# Patient Record
Sex: Female | Born: 2005 | Race: White | Hispanic: No | Marital: Single | Smoking: Never smoker
Health system: Southern US, Community
[De-identification: ages and names within clinical notes are randomized; demographics above are authoritative.]

## PROBLEM LIST (undated history)

## (undated) DIAGNOSIS — K5900 Constipation, unspecified: Secondary | ICD-10-CM

## (undated) DIAGNOSIS — F32A Depression, unspecified: Secondary | ICD-10-CM

## (undated) DIAGNOSIS — F329 Major depressive disorder, single episode, unspecified: Secondary | ICD-10-CM

## (undated) DIAGNOSIS — J45909 Unspecified asthma, uncomplicated: Secondary | ICD-10-CM

## (undated) DIAGNOSIS — G43D0 Abdominal migraine, not intractable: Secondary | ICD-10-CM

## (undated) DIAGNOSIS — H6690 Otitis media, unspecified, unspecified ear: Secondary | ICD-10-CM

## (undated) DIAGNOSIS — J309 Allergic rhinitis, unspecified: Secondary | ICD-10-CM

## (undated) DIAGNOSIS — F419 Anxiety disorder, unspecified: Secondary | ICD-10-CM

## (undated) DIAGNOSIS — K589 Irritable bowel syndrome without diarrhea: Secondary | ICD-10-CM

## (undated) HISTORY — DX: Abdominal migraine, not intractable: G43.D0

## (undated) HISTORY — PX: WISDOM TOOTH EXTRACTION: SHX21

## (undated) HISTORY — DX: Irritable bowel syndrome, unspecified: K58.9

## (undated) HISTORY — DX: Anxiety disorder, unspecified: F41.9

## (undated) HISTORY — DX: Constipation, unspecified: K59.00

## (undated) HISTORY — DX: Depression, unspecified: F32.A

## (undated) HISTORY — DX: Allergic rhinitis, unspecified: J30.9

## (undated) HISTORY — DX: Otitis media, unspecified, unspecified ear: H66.90

## (undated) HISTORY — DX: Unspecified asthma, uncomplicated: J45.909

---
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
## 2023-01-23 ENCOUNTER — Encounter: Payer: Self-pay | Admitting: Obstetrics & Gynecology

## 2023-01-23 ENCOUNTER — Telehealth: Payer: Self-pay | Admitting: *Deleted

## 2023-01-23 ENCOUNTER — Ambulatory Visit (INDEPENDENT_AMBULATORY_CARE_PROVIDER_SITE_OTHER): Payer: PRIVATE HEALTH INSURANCE | Admitting: Obstetrics & Gynecology

## 2023-01-23 VITALS — BP 121/78 | HR 76 | Ht 62.5 in | Wt 127.0 lb

## 2023-01-23 DIAGNOSIS — N6012 Diffuse cystic mastopathy of left breast: Secondary | ICD-10-CM

## 2023-01-23 DIAGNOSIS — N6011 Diffuse cystic mastopathy of right breast: Secondary | ICD-10-CM

## 2023-01-23 DIAGNOSIS — N926 Irregular menstruation, unspecified: Secondary | ICD-10-CM

## 2023-01-23 LAB — POCT URINE PREGNANCY: Preg Test, Ur: NEGATIVE

## 2023-01-23 NOTE — Telephone Encounter (Signed)
Patient called with c/o a breast lump that she noticed in her right breast about 4 days ago.  States it is getting worse to the point she notes an increase in the weight and size of the breast and says it is very painful to sleep.  She has tried using a warm compress to the area but with no relief.  States she is her LMP was 12/25/22 and is currently on LoLo estrin. Does note that she missed two pills after intercourse on 1/9 and 1/10.  Advised patient to come into the office for evaluation. Pt verbalized understanding and agreeable to come in as soon as possible.

## 2023-01-23 NOTE — Progress Notes (Signed)
Chief Complaint  Patient presents with   Breast Problem    Right breast painful lump, feels like its starting in the left side.       17 y.o. G0P0000 Patient's last menstrual period was 12/25/2022. The current method of family planning is OCP (estrogen/progesterone).  Outpatient Encounter Medications as of 01/23/2023  Medication Sig   albuterol (VENTOLIN HFA) 108 (90 Base) MCG/ACT inhaler Inhale 1-2 puffs into the lungs every 4 (four) hours as needed for wheezing or shortness of breath.   budesonide-formoterol (SYMBICORT) 160-4.5 MCG/ACT inhaler Inhale 2 puffs into the lungs 2 (two) times daily.   fluticasone (FLOVENT HFA) 110 MCG/ACT inhaler Inhale 2 puffs into the lungs in the morning and at bedtime.   Norethindrone-Ethinyl Estradiol-Fe Biphas (LO LOESTRIN FE) 1 MG-10 MCG / 10 MCG tablet Take 1 tablet by mouth daily. Take 1 daily by mouth   Respiratory Therapy Supplies (VORTEX HOLDING CHAMBER/MASK) DEVI Always use with inhaler to maximize drug delivery into the lungs.   [DISCONTINUED] ARIPiprazole (ABILIFY) 10 MG tablet TAKE 1 TABLET BY MOUTH  in the morning   [DISCONTINUED] ARIPiprazole (ABILIFY) 15 MG tablet Take 1 tablet (15 mg total) by mouth at bedtime.   [DISCONTINUED] buPROPion (WELLBUTRIN XL) 150 MG 24 hr tablet Take 1 tablet (150 mg total) by mouth every morning.   [DISCONTINUED] escitalopram (LEXAPRO) 10 MG tablet TAKE (1) TABLET BY MOUTH DAILY IN THE MORNING.   [DISCONTINUED] hydrOXYzine (ATARAX) 25 MG tablet Take 1 tablet (25 mg total) by mouth 3 (three) times daily as needed for anxiety.   [DISCONTINUED] lamoTRIgine (LAMICTAL) 25 MG tablet TAKE  2 each morning and 2 each evening   [DISCONTINUED] levocetirizine (XYZAL) 5 MG tablet Take 1 tablet (5 mg total) by mouth 2 (two) times daily as needed for allergies (Can take an extra dose during flare ups.).   No facility-administered encounter medications on file as of 01/23/2023.    Subjective Pt is seen as a work in  for increased breast tenderness She describes prominent veins "Lump" 4 days ago Increased tenderness that started last night Breast sonogram is reviewed, fibroadenoma is not palpated today Menstrual cycle is due any day now on lo lo estrin UPT today is negative  Past Medical History:  Diagnosis Date   Abdominal migraine    Allergic rhinitis    Anxiety    Asthma    Constipation    Depression    Irritable bowel syndrome    Otitis media     Past Surgical History:  Procedure Laterality Date   WISDOM TOOTH EXTRACTION      OB History     Gravida  0   Para  0   Term  0   Preterm  0   AB  0   Living  0      SAB  0   IAB  0   Ectopic  0   Multiple  0   Live Births  0           Allergies  Allergen Reactions   Other Hives and Other (See Comments)    Dust mites, mold-itchy, watery eyes    Social History   Socioeconomic History   Marital status: Single    Spouse name: Not on file   Number of children: 0   Years of education: Not on file   Highest education level: Not on file  Occupational History   Not on file  Tobacco Use   Smoking  status: Never    Passive exposure: Current   Smokeless tobacco: Never  Vaping Use   Vaping Use: Never used  Substance and Sexual Activity   Alcohol use: Never   Drug use: Never   Sexual activity: Yes    Birth control/protection: Condom, Pill  Other Topics Concern   Not on file  Social History Narrative   Not on file   Social Determinants of Health   Financial Resource Strain: Not on file  Food Insecurity: Not on file  Transportation Needs: Not on file  Physical Activity: Not on file  Stress: Not on file  Social Connections: Not on file    Family History  Problem Relation Age of Onset   Asthma Brother    Asthma Brother    Asthma Brother    Anxiety disorder Maternal Grandfather    Depression Maternal Grandfather    Cancer Paternal Grandmother     Medications:       Current Outpatient  Medications:    albuterol (VENTOLIN HFA) 108 (90 Base) MCG/ACT inhaler, Inhale 1-2 puffs into the lungs every 4 (four) hours as needed for wheezing or shortness of breath., Disp: 2 each, Rfl: 0   budesonide-formoterol (SYMBICORT) 160-4.5 MCG/ACT inhaler, Inhale 2 puffs into the lungs 2 (two) times daily., Disp: 11 g, Rfl: 5   fluticasone (FLOVENT HFA) 110 MCG/ACT inhaler, Inhale 2 puffs into the lungs in the morning and at bedtime., Disp: 1 each, Rfl: 2   Norethindrone-Ethinyl Estradiol-Fe Biphas (LO LOESTRIN FE) 1 MG-10 MCG / 10 MCG tablet, Take 1 tablet by mouth daily. Take 1 daily by mouth, Disp: 28 tablet, Rfl: 11   Respiratory Therapy Supplies (VORTEX HOLDING CHAMBER/MASK) DEVI, Always use with inhaler to maximize drug delivery into the lungs., Disp: 2 each, Rfl: 1  Objective Blood pressure 121/78, pulse 76, height 5' 2.5" (1.588 m), weight 127 lb (57.6 kg), last menstrual period 12/25/2022.  Breasts scattered fibrocystic breast changes, no discrete masses or discrete areas noted, no erythema skin or nipple changes noted No suspicion for mastitis on exam No axillary lymphadenopathy noted  Pertinent ROS No burning with urination, frequency or urgency No nausea, vomiting or diarrhea Nor fever chills or other constitutional symptoms   Labs or studies Reviewed sonogram    Impression + Management Plan: Diagnoses this Encounter::   ICD-10-CM   1. Fibrocystic breast changes of both breasts  N60.11    N60.12    likely related in timing to menstrual cycle, no discrete mass so no indication for imaging at present.  Re examine in 10-14 days and review symptom evolution    2. Late period  N92.6 POCT urine pregnancy        Medications prescribed during  this encounter: No orders of the defined types were placed in this encounter.   Labs or Scans Ordered during this encounter: Orders Placed This Encounter  Procedures   POCT urine pregnancy      Follow up Return for 10-14  days with Anderson Malta for re evaluation.

## 2023-01-26 ENCOUNTER — Ambulatory Visit: Payer: PRIVATE HEALTH INSURANCE | Admitting: Adult Health

## 2023-02-06 ENCOUNTER — Other Ambulatory Visit (HOSPITAL_COMMUNITY)
Admission: RE | Admit: 2023-02-06 | Discharge: 2023-02-06 | Disposition: A | Discharge status: Home / Self Care | Payer: PRIVATE HEALTH INSURANCE | Source: Ambulatory Visit | Attending: Adult Health | Admitting: Adult Health

## 2023-02-06 ENCOUNTER — Encounter: Payer: Self-pay | Admitting: Adult Health

## 2023-02-06 ENCOUNTER — Ambulatory Visit (INDEPENDENT_AMBULATORY_CARE_PROVIDER_SITE_OTHER): Payer: PRIVATE HEALTH INSURANCE | Admitting: Adult Health

## 2023-02-06 VITALS — BP 104/67 | HR 71 | Ht 62.0 in | Wt 130.0 lb

## 2023-02-06 DIAGNOSIS — N9489 Other specified conditions associated with female genital organs and menstrual cycle: Secondary | ICD-10-CM | POA: Diagnosis not present

## 2023-02-06 DIAGNOSIS — Z113 Encounter for screening for infections with a predominantly sexual mode of transmission: Secondary | ICD-10-CM | POA: Insufficient documentation

## 2023-02-06 DIAGNOSIS — N644 Mastodynia: Secondary | ICD-10-CM

## 2023-02-06 DIAGNOSIS — Z30016 Encounter for initial prescription of transdermal patch hormonal contraceptive device: Secondary | ICD-10-CM

## 2023-02-06 MED ORDER — NORELGESTROMIN-ETH ESTRADIOL 150-35 MCG/24HR TD PTWK: 1.0000 | MEDICATED_PATCH | TRANSDERMAL | 12 refills | Status: DC

## 2023-02-06 MED ORDER — VITAMIN B-6 25 MG PO TABS: ORAL_TABLET | ORAL | Status: DC

## 2023-02-06 NOTE — Progress Notes (Signed)
  Subjective:     Patient ID: Brenda Benton, female   DOB: 2006-08-08, 17 y.o.   MRN: [id number]  HPI Brenda Benton is a 17 year old white female,single, G0P0, back in follow up on having breast tenderness, had seen Dr Elonda Husky, breasts still tender but not has bad, and now has some low cramping, and she wants to change from pill to patch for birth control.   Review of Systems Breast tenderness not as bad +cramping Reviewed past medical,surgical, social and family history. Reviewed medications and allergies.     Objective:   Physical Exam BP 104/67 (BP Location: Left Arm, Patient Position: Sitting, Cuff Size: Normal)   Pulse 71   Ht '5\' 2"'$  (1.575 m)   Wt 130 lb (59 kg)   LMP 01/24/2023   BMI 23.78 kg/m      Skin warm and dry,  Breasts:no dominate palpable mass, retraction or nipple discharge, has regular irregularities, on both breast. Pelvic: external genitalia is normal in appearance no lesions, vagina: white discharge without odor,urethra has no lesions or masses noted, cervix:smooth, uterus: normal size, shape and contour, non tender, no masses felt, adnexa: no masses, mild RLQ tenderness noted. Bladder is non tender and no masses felt. CV swab obtained.  [facility] - 02/06/23 1100       Pregnancy Intention Screening   Does the patient want to become pregnant in the next year? No    Does the patient's partner want to become pregnant in the next year? No    Would the patient like to discuss contraceptive options today? Yes      Contraception Wrap Up   Current Method Oral Contraceptive    End Method Contraceptive Patch    Contraception Counseling Provided Yes    How was the end contraceptive method provided? Prescription            Examination chaperoned by Brenda Pupa LPN  Assessment:     1. Breast tenderness Better, but will try B6 25 mg tid   2. Uterine cramping CV swab sent  - Cervicovaginal ancillary only( [place])  3. Screen for STD (sexually transmitted disease) CV swab  sent for GC/CHL,trich and BV, yeast  - Cervicovaginal ancillary only( [place])  4. Encounter for initial prescription of transdermal patch hormonal contraceptive device Finish current pack of pills and then start patch, use condoms for at least 1 month  Meds ordered this encounter  Medications   pyridOXINE (VITAMIN B6) 25 MG tablet    Sig: Take 1 tid    Order Specific Question:   Supervising Provider    Answer:   Florian Buff [[id number]]   norelgestromin-ethinyl estradiol [name]) 150-35 MCG/24HR transdermal patch    Sig: Place 1 patch onto the skin once a week.    Dispense:  3 patch    Refill:  12    Order Specific Question:   Supervising Provider    Answer:   Florian Buff [[id number]]        Plan:     Follow up in 3 months for ROS

## 2023-02-09 LAB — CERVICOVAGINAL ANCILLARY ONLY
Bacterial Vaginitis (gardnerella): NEGATIVE
Candida Glabrata: NEGATIVE
Candida Vaginitis: NEGATIVE
Chlamydia: NEGATIVE
Comment: NEGATIVE
Comment: NEGATIVE
Comment: NEGATIVE
Comment: NEGATIVE
Comment: NEGATIVE
Comment: NORMAL
Neisseria Gonorrhea: NEGATIVE
Trichomonas: NEGATIVE

## 2023-02-13 ENCOUNTER — Emergency Department
Admission: EM | Admit: 2023-02-13 | Discharge: 2023-02-13 | Disposition: A | Discharge status: Home / Self Care | Payer: Medicaid Other | Attending: Emergency Medicine | Admitting: Emergency Medicine

## 2023-02-13 ENCOUNTER — Other Ambulatory Visit: Payer: Self-pay

## 2023-02-13 ENCOUNTER — Encounter: Payer: Self-pay | Admitting: Emergency Medicine

## 2023-02-13 ENCOUNTER — Emergency Department: Payer: Medicaid Other

## 2023-02-13 DIAGNOSIS — J45909 Unspecified asthma, uncomplicated: Secondary | ICD-10-CM | POA: Insufficient documentation

## 2023-02-13 DIAGNOSIS — R1030 Lower abdominal pain, unspecified: Secondary | ICD-10-CM | POA: Insufficient documentation

## 2023-02-13 DIAGNOSIS — T391X1A Poisoning by 4-Aminophenol derivatives, accidental (unintentional), initial encounter: Secondary | ICD-10-CM | POA: Insufficient documentation

## 2023-02-13 DIAGNOSIS — Z7951 Long term (current) use of inhaled steroids: Secondary | ICD-10-CM | POA: Insufficient documentation

## 2023-02-13 DIAGNOSIS — R002 Palpitations: Secondary | ICD-10-CM | POA: Insufficient documentation

## 2023-02-13 DIAGNOSIS — T39011A Poisoning by aspirin, accidental (unintentional), initial encounter: Secondary | ICD-10-CM | POA: Diagnosis not present

## 2023-02-13 DIAGNOSIS — N3000 Acute cystitis without hematuria: Secondary | ICD-10-CM

## 2023-02-13 DIAGNOSIS — T43611A Poisoning by caffeine, accidental (unintentional), initial encounter: Secondary | ICD-10-CM | POA: Diagnosis present

## 2023-02-13 DIAGNOSIS — R791 Abnormal coagulation profile: Secondary | ICD-10-CM | POA: Diagnosis not present

## 2023-02-13 DIAGNOSIS — T50901A Poisoning by unspecified drugs, medicaments and biological substances, accidental (unintentional), initial encounter: Secondary | ICD-10-CM

## 2023-02-13 LAB — URINALYSIS, ROUTINE W REFLEX MICROSCOPIC
Bilirubin Urine: NEGATIVE
Glucose, UA: NEGATIVE mg/dL
Hgb urine dipstick: NEGATIVE
Ketones, ur: 5 mg/dL — AB
Nitrite: NEGATIVE
Protein, ur: 30 mg/dL — AB
Specific Gravity, Urine: 1.025 (ref 1.005–1.030)
pH: 6 (ref 5.0–8.0)

## 2023-02-13 LAB — SALICYLATE LEVEL: Salicylate Lvl: 7 mg/dL (ref 7.0–30.0)

## 2023-02-13 LAB — CBC WITH DIFFERENTIAL/PLATELET
Abs Immature Granulocytes: 0.04 10*3/uL (ref 0.00–0.07)
Basophils Absolute: 0.1 10*3/uL (ref 0.0–0.1)
Basophils Relative: 1 %
Eosinophils Absolute: 0.3 10*3/uL (ref 0.0–0.5)
Eosinophils Relative: 3 %
HCT: 36.8 % (ref 36.0–46.0)
Hemoglobin: 12.6 g/dL (ref 12.0–15.0)
Immature Granulocytes: 0 %
Lymphocytes Relative: 26 %
Lymphs Abs: 2.3 10*3/uL (ref 0.7–4.0)
MCH: 30.1 pg (ref 26.0–34.0)
MCHC: 34.2 g/dL (ref 30.0–36.0)
MCV: 87.8 fL (ref 80.0–100.0)
Monocytes Absolute: 0.9 10*3/uL (ref 0.1–1.0)
Monocytes Relative: 10 %
Neutro Abs: 5.5 10*3/uL (ref 1.7–7.7)
Neutrophils Relative %: 60 %
Platelets: 424 10*3/uL — ABNORMAL HIGH (ref 150–400)
RBC: 4.19 MIL/uL (ref 3.87–5.11)
RDW: 12.2 % (ref 11.5–15.5)
WBC: 9.1 10*3/uL (ref 4.0–10.5)
nRBC: 0 % (ref 0.0–0.2)

## 2023-02-13 LAB — COMPREHENSIVE METABOLIC PANEL
ALT: 22 U/L (ref 0–44)
AST: 32 U/L (ref 15–41)
Albumin: 4.6 g/dL (ref 3.5–5.0)
Alkaline Phosphatase: 52 U/L (ref 38–126)
Anion gap: 10 (ref 5–15)
BUN: 14 mg/dL (ref 6–20)
CO2: 21 mmol/L — ABNORMAL LOW (ref 22–32)
Calcium: 9.7 mg/dL (ref 8.9–10.3)
Chloride: 105 mmol/L (ref 98–111)
Creatinine, Ser: 0.7 mg/dL (ref 0.44–1.00)
GFR, Estimated: >60 mL/min (ref >60)
Glucose, Bld: 108 mg/dL — ABNORMAL HIGH (ref 70–99)
Potassium: 3.1 mmol/L — ABNORMAL LOW (ref 3.5–5.1)
Sodium: 136 mmol/L (ref 135–145)
Total Bilirubin: 0.6 mg/dL (ref 0.3–1.2)
Total Protein: 8.1 g/dL (ref 6.5–8.1)

## 2023-02-13 LAB — MAGNESIUM: Magnesium: 2.3 mg/dL (ref 1.7–2.4)

## 2023-02-13 LAB — D-DIMER, QUANTITATIVE: D-Dimer, Quant: 0.69 ug/mL-FEU — ABNORMAL HIGH (ref 0.00–0.50)

## 2023-02-13 LAB — T4, FREE: Free T4: 1.11 ng/dL (ref 0.61–1.12)

## 2023-02-13 LAB — LIPASE, BLOOD: Lipase: 35 U/L (ref 11–51)

## 2023-02-13 LAB — POC URINE PREG, ED: Preg Test, Ur: NEGATIVE

## 2023-02-13 LAB — TSH: TSH: 6.99 u[IU]/mL — ABNORMAL HIGH (ref 0.350–4.500)

## 2023-02-13 LAB — ACETAMINOPHEN LEVEL: Acetaminophen (Tylenol), Serum: <10 ug/mL — ABNORMAL LOW (ref 10–30)

## 2023-02-13 LAB — TROPONIN I (HIGH SENSITIVITY): Troponin I (High Sensitivity): <2 ng/L (ref <18)

## 2023-02-13 MED ORDER — POTASSIUM CHLORIDE CRYS ER 20 MEQ PO TBCR
40.0000 meq | EXTENDED_RELEASE_TABLET | Freq: Once | ORAL | Status: AC
  Administered 2023-02-13: 40 meq via ORAL
  Filled 2023-02-13: qty 2

## 2023-02-13 MED ORDER — CEPHALEXIN 500 MG PO CAPS: 500.0000 mg | ORAL_CAPSULE | Freq: Four times a day (QID) | ORAL | 0 refills | Status: AC

## 2023-02-13 MED ORDER — KETOROLAC TROMETHAMINE 30 MG/ML IJ SOLN
30.0000 mg | Freq: Once | INTRAMUSCULAR | Status: AC
  Administered 2023-02-13: 30 mg via INTRAVENOUS
  Filled 2023-02-13: qty 1

## 2023-02-13 MED ORDER — SODIUM CHLORIDE 0.9 % IV SOLN
2.0000 g | Freq: Once | INTRAVENOUS | Status: AC
  Administered 2023-02-13: 2 g via INTRAVENOUS
  Filled 2023-02-13: qty 20

## 2023-02-13 MED ORDER — IOHEXOL 350 MG/ML SOLN
60.0000 mL | Freq: Once | INTRAVENOUS | Status: AC | PRN
  Administered 2023-02-13: 60 mL via INTRAVENOUS

## 2023-02-13 MED ORDER — LORAZEPAM 2 MG/ML IJ SOLN
1.0000 mg | Freq: Once | INTRAMUSCULAR | Status: DC
  Filled 2023-02-13: qty 1

## 2023-02-13 MED ORDER — ONDANSETRON HCL 4 MG/2ML IJ SOLN
4.0000 mg | Freq: Once | INTRAMUSCULAR | Status: DC
  Filled 2023-02-13: qty 2

## 2023-02-13 NOTE — ED Triage Notes (Signed)
Patient ambulatory to triage with steady gait, without difficulty or distress noted; pt reports she awoke hr PTA with lower abd pain, feeling shaky, SHOB and heart racing; st she was concerned because she took 2 excedrin migraine at 10pm and again at 2am thinking it was tylenol

## 2023-02-13 NOTE — ED Provider Notes (Signed)
[facility] Provider Note    Event Date/Time   First MD Initiated Contact with Patient 02/13/23 [phone]     (approximate)   History   Shortness of Breath   HPI  Brenda Benton is a 17 y.o. female with history of depression, anxiety, asthma, abdominal migraines who presents to the emergency department with complaints of lower diffuse abdominal pain that radiates into her back for the past few days with increased urinary frequency and urgency but no dysuria or hematuria.  States she has not had UTIs before and this felt similar but progressively worsened and now she is worried about appendicitis, kidney stones although no history of the same.  She denies any vaginal bleeding, discharge.  LMP 01/24/2023.  She states tonight secondary to the pain she took 2 Excedrin Migraine capsules at 10 PM.  States she got up again at 2 AM and took another 2 tablets mistaking them for Tylenol.  She woke up about a hour prior to arrival and felt very anxious, shaky and that her heart was racing.  She states she has had chest discomfort and shortness of breath.  She denies that she took anything else today and denies that this was intentional.  Patient does report being on oral contraceptives.  She has no history of PE or DVT.  No lower extremity swelling or pain.   History provided by patient, boyfriend's mother.    Past Medical History:  Diagnosis Date   Abdominal migraine    Allergic rhinitis    Anxiety    Asthma    Constipation    Depression    Irritable bowel syndrome    Otitis media     Past Surgical History:  Procedure Laterality Date   WISDOM TOOTH EXTRACTION      MEDICATIONS:  Prior to Admission medications   Medication Sig Start Date End Date Taking? Authorizing Provider  albuterol (VENTOLIN HFA) 108 (90 Base) MCG/ACT inhaler Inhale 1-2 puffs into the lungs every 4 (four) hours as needed for wheezing or shortness of breath. 09/20/21   Iven Finn, DO   budesonide-formoterol (SYMBICORT) 160-4.5 MCG/ACT inhaler Inhale 2 puffs into the lungs 2 (two) times daily. 03/28/22   Valentina Shaggy, MD  fluticasone (FLOVENT HFA) 110 MCG/ACT inhaler Inhale 2 puffs into the lungs in the morning and at bedtime. 12/20/21   Iven Finn, DO  norelgestromin-ethinyl estradiol [name]) 150-35 MCG/24HR transdermal patch Place 1 patch onto the skin once a week. 02/06/23   Estill Dooms, NP  pyridOXINE (VITAMIN B6) 25 MG tablet Take 1 tid 02/06/23   Estill Dooms, NP  Respiratory Therapy Supplies (VORTEX HOLDING CHAMBER/MASK) [place] Always use with inhaler to maximize drug delivery into the lungs. 09/20/21   Iven Finn, DO    Physical Exam   Triage Vital Signs: ED Triage Vitals  Enc Vitals Group     BP 02/13/23 0510 133/76     Pulse Rate 02/13/23 0510 (!) 123     Resp 02/13/23 0510 (!) 22     Temp 02/13/23 0510 98.4 F (36.9 C)     Temp Source 02/13/23 0510 Oral     SpO2 02/13/23 0510 99 %     Weight 02/13/23 0514 130 lb (59 kg)     Height 02/13/23 0514 5' 4"$  (1.626 m)     Head Circumference --      Peak Flow --      Pain Score 02/13/23 0514 7     Pain Loc --  Pain Edu? --      Excl. in [place]? --     Most recent vital signs: Vitals:   02/13/23 0510 02/13/23 0630  BP: 133/76 (!) 103/58  Pulse: (!) 123 72  Resp: (!) 22 14  Temp: 98.4 F (36.9 C)   SpO2: 99% 100%    CONSTITUTIONAL: Alert, responds appropriately to questions. Well-appearing; well-nourished, appears extremely anxious HEAD: Normocephalic, atraumatic EYES: Conjunctivae clear, pupils appear equal, sclera nonicteric ENT: normal nose; moist mucous membranes NECK: Supple, normal ROM CARD: Regular and tachycardic; S1 and S2 appreciated RESP: Normal chest excursion without splinting or tachypnea; breath sounds clear and equal bilaterally; no wheezes, no rhonchi, no rales, no hypoxia or respiratory distress, speaking full sentences ABD/GI: Non-distended; soft,  slightly tender throughout the lower abdomen BACK: The back appears normal EXT: Normal ROM in all joints; no deformity noted, no edema SKIN: Normal color for age and race; warm; no rash on exposed skin NEURO: Moves all extremities equally, normal speech, tremulous, no facial asymmetry PSYCH: Anxious.  Denies SI.   ED Results / Procedures / Treatments   LABS: (all labs ordered are listed, but only abnormal results are displayed) Labs Reviewed  CBC WITH DIFFERENTIAL/PLATELET - Abnormal; Notable for the following components:      Result Value   Platelets 424 (*)    All other components within normal limits  URINALYSIS, ROUTINE W REFLEX MICROSCOPIC - Abnormal; Notable for the following components:   Color, Urine YELLOW (*)    APPearance CLOUDY (*)    Ketones, ur 5 (*)    Protein, ur 30 (*)    Leukocytes,Ua SMALL (*)    Bacteria, UA RARE (*)    All other components within normal limits  D-DIMER, QUANTITATIVE - Abnormal; Notable for the following components:   D-Dimer, Quant 0.69 (*)    All other components within normal limits  URINE CULTURE  COMPREHENSIVE METABOLIC PANEL  LIPASE, BLOOD  MAGNESIUM  TSH  ACETAMINOPHEN LEVEL  SALICYLATE LEVEL  POC URINE PREG, ED  TROPONIN I (HIGH SENSITIVITY)     EKG:  EKG Interpretation  Date/Time:  Friday February 13 2023 05:21:56 EST Ventricular Rate:  99 PR Interval:  156 QRS Duration: 78 QT Interval:  324 QTC Calculation: 416 R Axis:   88 Text Interpretation: Sinus rhythm Abnormal Q suggests anterior infarct Borderline T abnormalities, anterior leads Confirmed by Pryor Curia [phone]) on 02/13/2023 5:24:06 AM         RADIOLOGY: My personal review and interpretation of imaging: X-ray clear.  I have personally reviewed all radiology reports.   DG Chest Portable 1 View  Result Date: 02/13/2023 CLINICAL DATA:  Shortness of breath EXAM: PORTABLE CHEST 1 VIEW COMPARISON:  None Available. FINDINGS: Artifact from EKG leads. Normal  heart size and mediastinal contours. No acute infiltrate or edema. No effusion or pneumothorax. No acute osseous findings. IMPRESSION: Negative portable chest. Electronically Signed   By: Jorje Guild M.D.   On: 02/13/2023 06:00     PROCEDURES:  Critical Care performed: No     .1-3 Lead EKG Interpretation  Performed by: Foxx Klarich, Delice Bison, DO Authorized by: Michie Molnar, Delice Bison, DO     Interpretation: abnormal     ECG rate:  123   ECG rate assessment: tachycardic     Rhythm: sinus tachycardia     Ectopy: none     Conduction: normal       IMPRESSION / MDM / ASSESSMENT AND PLAN / ED COURSE  I reviewed the triage  vital signs and the nursing notes.    Patient here with complaints of lower abdominal pain, flank pain, urinary frequency and urgency.   Also complaining of palpitations, chest pain and shortness of breath and feeling very shaky and anxious after accidentally taking 4 tablets of Excedrin Migraine in a 4-hour period.  The patient is on the cardiac monitor to evaluate for evidence of arrhythmia and/or significant heart rate changes.   DIFFERENTIAL DIAGNOSIS (includes but not limited to):   UTI, pyelonephritis, kidney stone, less likely appendicitis based on abdominal exam, torsion, TOA, PID   Patient's presentation is most consistent with acute presentation with potential threat to life or bodily function.   PLAN: Patient here with multiple complaints.  Will obtain CBC, CMP, lipase, urinalysis, urine pregnancy test, CT renal study to evaluate lower abdominal and flank pain.  Will give Toradol, Zofran, IV fluids.   Patient also having palpitations and is tachycardic, tachypneic.  She is on birth control.  This could be from the 260 mg of caffeine that she ingested from the Excedrin Migraine however we discussed this is likely not more than many energy drinks or 2 to 3 cups of coffee.  Have offered her Ativan.  Will check electrolytes, TSH, troponin and also D-dimer given  she is on oral contraceptives.  Will obtain chest x-ray.  EKG nonischemic.  Patient is on cardiac monitoring.  She is in a sinus tachycardia now without arrhythmias.   As for her overdose, patient ingested a total of the 1000 mg of acetaminophen, 1000 mg of aspirin (only 17 mg/kg) and 260 mg of caffeine.  Discussed with patient that this is not more than the allotted dose in 24 hours.  She states that this was not intentional.  Will check acetaminophen and salicylate levels to ensure they are not at toxic range especially given her psychiatric history but she denies any other coingestions or attempt at self-harm today.    MEDICATIONS GIVEN IN ED: Medications  LORazepam (ATIVAN) injection 1 mg (1 mg Intravenous Not Given 02/13/23 0606)  ondansetron [facility]) injection 4 mg (4 mg Intravenous Not Given 02/13/23 0605)  cefTRIAXone (ROCEPHIN) 2 g in sodium chloride 0.9 % 100 mL IVPB (has no administration in time range)  ketorolac (TORADOL) 30 MG/ML injection 30 mg (30 mg Intravenous Given 02/13/23 0605)     ED COURSE: Patient given Toradol and states that it burned going in her IV and now refusing Zofran, Ativan and IV fluids.   Patient's labs show no leukocytosis.  Normal hemoglobin.  Urine appears possibly infected versus contaminated as there are many squamous cells.  Culture is pending.  Given she is symptomatic, will give Rocephin.  D-dimer has come back elevated at 0.69.  Will obtain CTA of the chest given she was tachycardic and tachypneic on arrival with chest pain, shortness of breath and palpitations.  Chest x-ray reviewed and interpreted by myself and the radiologist and shows no acute abnormality.  The rest of her lab work, CT scans pending.  Signed out to oncoming ED physician.  CONSULTS:  pending further workup   OUTSIDE RECORDS REVIEWED: Reviewed patient's previous psychiatric notes.       FINAL CLINICAL IMPRESSION(S) / ED DIAGNOSES   Final diagnoses:  Lower abdominal pain   Palpitations  Accidental overdose, initial encounter     Rx / DC Orders   ED Discharge Orders     None        Note:  This document was prepared using Dragon voice  recognition software and may include unintentional dictation errors.   Saidee Geremia, Delice Bison, DO 02/13/23 [phone]

## 2023-02-13 NOTE — ED Provider Notes (Signed)
-----------------------------------------   7:03 AM on 02/13/2023 -----------------------------------------  Blood pressure (!) 103/58, pulse 72, temperature 98.4 F (36.9 C), temperature source Oral, resp. rate 14, height 5' 4"$  (1.626 m), weight 59 kg, last menstrual period 01/24/2023, SpO2 100 %.  Assuming care from Dr. Leonides Schanz.  In short, Brenda Benton is a 17 y.o. female with a chief complaint of Shortness of Breath .  Refer to the original H&P for additional details.  The current plan of care is to follow-up lab and CT results for chest and abdominal pain.  ----------------------------------------- 9:37 AM on 02/13/2023 ----------------------------------------- CT abdomen/pelvis is negative for acute process, CTA of chest is also unremarkable.  Patient feeling better on reassessment, did complain of burning discomfort with IV Rocephin but no evidence of allergic reaction.  She is appropriate for discharge home with treatment for UTI, will prescribe Keflex.  She was counseled to follow-up with her PCP and to return to the ED for new or worsening symptoms, patient and mother agree with plan.    Blake Divine, MD 02/13/23 [phone]

## 2023-02-13 NOTE — ED Notes (Signed)
Patient reported rocephin burning at IV site, ran NS for comfort.

## 2023-02-14 LAB — URINE CULTURE: Culture: 30000 — AB

## 2023-02-16 ENCOUNTER — Telehealth: Payer: Self-pay | Admitting: Emergency Medicine

## 2023-02-16 NOTE — Telephone Encounter (Signed)
Patient called saying that she has received several voicemails yesterday that say her labs are back.  She does not have access to mychart--says her mother has access.  I told her she needs to get access as she is 16 now.  I do not see any notes indicating what she was called for or who called her.  I reviewed her labs.  Her TSH is elevated-- I informed her of this and that she needs to do follow up on this.  She has a pcp. I have asked her to call her pcp today to arrange follow up and have them review the labs done here.  She agrees.

## 2023-05-07 ENCOUNTER — Encounter: Payer: Self-pay | Admitting: Adult Health

## 2023-05-07 ENCOUNTER — Ambulatory Visit: Payer: PRIVATE HEALTH INSURANCE | Admitting: Adult Health

## 2023-05-07 VITALS — BP 98/63 | HR 65 | Ht 63.0 in | Wt 120.5 lb

## 2023-05-07 DIAGNOSIS — Z3045 Encounter for surveillance of transdermal patch hormonal contraceptive device: Secondary | ICD-10-CM | POA: Insufficient documentation

## 2023-05-07 DIAGNOSIS — L709 Acne, unspecified: Secondary | ICD-10-CM

## 2023-05-07 DIAGNOSIS — N9489 Other specified conditions associated with female genital organs and menstrual cycle: Secondary | ICD-10-CM

## 2023-05-07 MED ORDER — CLINDAMYCIN PHOSPHATE 1 % EX GEL: Freq: Two times a day (BID) | CUTANEOUS | 6 refills | Status: DC

## 2023-05-07 NOTE — Progress Notes (Addendum)
  Subjective:     Patient ID: Brenda Benton, female   DOB: 09/08/2006, 17 y.o.   MRN: [id number]  HPI [name] is a 17 year old white female,single, G0P0, back in follow up on starting xulane and she likes the patch,periods good. Has had some cramping more in the back now and has acne on her back. She has noticed nausea at times after eating, esp in afternoon. Appetite is good. She has less breast tenderness.  Negative GC/CHL 02/06/23.  Review of Systems Periods good +cramping at times +acne on back +nausea after eating esp in afternoon, appetite good, she has zofran at home  Less breast tenderness Reviewed past medical,surgical, social and family history. Reviewed medications and allergies.     Objective:   Physical Exam BP 98/63 (BP Location: Left Arm, Patient Position: Sitting, Cuff Size: Normal)   Pulse 65   Ht 5\' 3"  (1.6 m)   Wt 120 lb 8 oz (54.7 kg)   LMP 04/16/2023 Comment: patch  BMI 21.35 kg/m  she has lost about 9 lbs since February.  Skin warm and dry. She does have some acne on back. Lungs: clear to ausculation bilaterally. Cardiovascular: regular rate and rhythm.    Fall risk is low  Upstream - 05/07/23 1109       Pregnancy Intention Screening   Does the patient want to become pregnant in the next year? No    Does the patient's partner want to become pregnant in the next year? No    Would the patient like to discuss contraceptive options today? No      Contraception Wrap Up   Current Method Contraceptive Patch    End Method Contraceptive Patch             Assessment:     1. Encounter for surveillance of transdermal patch hormonal contraceptive device She is happy with Xulane  Continue the patch, has refills   2. Acne, unspecified acne type +acne on back Will rx clindamycin gel, and sleep with hair up Meds ordered this encounter  Medications   clindamycin (CLINDAGEL) 1 % gel    Sig: Apply topically 2 (two) times daily.    Dispense:  30 g    Refill:  6     Order Specific Question:   Supervising Provider    Answer:   Despina Hidden, LUTHER H [[id number]]     3. Uterine cramping Has cramping mor towards back but ibuprofen helps    Plan:     Follow up in 3 months or sooner if needed

## 2023-05-11 ENCOUNTER — Telehealth: Payer: Self-pay | Admitting: *Deleted

## 2023-05-11 NOTE — Telephone Encounter (Signed)
Pt's insurance has approved Clindamycin gel. Total cost $38.24. Pt aware. JSY

## 2023-05-22 ENCOUNTER — Encounter: Payer: Self-pay | Admitting: *Deleted

## 2023-06-03 ENCOUNTER — Emergency Department (HOSPITAL_COMMUNITY): Payer: PRIVATE HEALTH INSURANCE

## 2023-06-03 ENCOUNTER — Emergency Department (HOSPITAL_COMMUNITY)
Admission: EM | Admit: 2023-06-03 | Discharge: 2023-06-03 | Disposition: A | Discharge status: Home / Self Care | Payer: PRIVATE HEALTH INSURANCE | Attending: Emergency Medicine | Admitting: Emergency Medicine

## 2023-06-03 ENCOUNTER — Encounter (HOSPITAL_COMMUNITY): Payer: Self-pay

## 2023-06-03 ENCOUNTER — Other Ambulatory Visit: Payer: Self-pay

## 2023-06-03 ENCOUNTER — Telehealth: Payer: Self-pay | Admitting: *Deleted

## 2023-06-03 DIAGNOSIS — K5900 Constipation, unspecified: Secondary | ICD-10-CM | POA: Diagnosis not present

## 2023-06-03 DIAGNOSIS — E876 Hypokalemia: Secondary | ICD-10-CM | POA: Insufficient documentation

## 2023-06-03 DIAGNOSIS — R109 Unspecified abdominal pain: Secondary | ICD-10-CM

## 2023-06-03 LAB — CBC WITH DIFFERENTIAL/PLATELET
Abs Immature Granulocytes: 0.02 10*3/uL (ref 0.00–0.07)
Basophils Absolute: 0.1 10*3/uL (ref 0.0–0.1)
Basophils Relative: 1 %
Eosinophils Absolute: 0.5 10*3/uL (ref 0.0–0.5)
Eosinophils Relative: 6 %
HCT: 36.6 % (ref 36.0–46.0)
Hemoglobin: 12.3 g/dL (ref 12.0–15.0)
Immature Granulocytes: 0 %
Lymphocytes Relative: 41 %
Lymphs Abs: 3.3 10*3/uL (ref 0.7–4.0)
MCH: 30.4 pg (ref 26.0–34.0)
MCHC: 33.6 g/dL (ref 30.0–36.0)
MCV: 90.6 fL (ref 80.0–100.0)
Monocytes Absolute: 0.9 10*3/uL (ref 0.1–1.0)
Monocytes Relative: 11 %
Neutro Abs: 3.3 10*3/uL (ref 1.7–7.7)
Neutrophils Relative %: 41 %
Platelets: 387 10*3/uL (ref 150–400)
RBC: 4.04 MIL/uL (ref 3.87–5.11)
RDW: 12.6 % (ref 11.5–15.5)
WBC: 8.1 10*3/uL (ref 4.0–10.5)
nRBC: 0 % (ref 0.0–0.2)

## 2023-06-03 LAB — COMPREHENSIVE METABOLIC PANEL
ALT: 12 U/L (ref 0–44)
AST: 17 U/L (ref 15–41)
Albumin: 4.1 g/dL (ref 3.5–5.0)
Alkaline Phosphatase: 50 U/L (ref 38–126)
Anion gap: 6 (ref 5–15)
BUN: 10 mg/dL (ref 6–20)
CO2: 25 mmol/L (ref 22–32)
Calcium: 8.7 mg/dL — ABNORMAL LOW (ref 8.9–10.3)
Chloride: 103 mmol/L (ref 98–111)
Creatinine, Ser: 0.62 mg/dL (ref 0.44–1.00)
GFR, Estimated: >60 mL/min (ref >60)
Glucose, Bld: 93 mg/dL (ref 70–99)
Potassium: 3.2 mmol/L — ABNORMAL LOW (ref 3.5–5.1)
Sodium: 134 mmol/L — ABNORMAL LOW (ref 135–145)
Total Bilirubin: 0.5 mg/dL (ref 0.3–1.2)
Total Protein: 7.2 g/dL (ref 6.5–8.1)

## 2023-06-03 LAB — URINALYSIS, ROUTINE W REFLEX MICROSCOPIC
Bilirubin Urine: NEGATIVE
Glucose, UA: NEGATIVE mg/dL
Hgb urine dipstick: NEGATIVE
Ketones, ur: NEGATIVE mg/dL
Leukocytes,Ua: NEGATIVE
Nitrite: NEGATIVE
Protein, ur: NEGATIVE mg/dL
Specific Gravity, Urine: 1.011 (ref 1.005–1.030)
pH: 8 (ref 5.0–8.0)

## 2023-06-03 LAB — LIPASE, BLOOD: Lipase: 37 U/L (ref 11–51)

## 2023-06-03 LAB — POC URINE PREG, ED: Preg Test, Ur: NEGATIVE

## 2023-06-03 MED ORDER — POLYETHYLENE GLYCOL 3350 17 G PO PACK: 17.0000 g | PACK | Freq: Every day | ORAL | 3 refills | Status: DC

## 2023-06-03 MED ORDER — KETOROLAC TROMETHAMINE 15 MG/ML IJ SOLN
15.0000 mg | Freq: Once | INTRAMUSCULAR | Status: AC
  Administered 2023-06-03: 15 mg via INTRAVENOUS
  Filled 2023-06-03: qty 1

## 2023-06-03 MED ORDER — IOHEXOL 300 MG/ML  SOLN
75.0000 mL | Freq: Once | INTRAMUSCULAR | Status: AC | PRN
  Administered 2023-06-03: 75 mL via INTRAVENOUS

## 2023-06-03 MED ORDER — HYDROXYZINE HCL 50 MG/ML IM SOLN
25.0000 mg | Freq: Once | INTRAMUSCULAR | Status: AC
  Administered 2023-06-03: 25 mg via INTRAMUSCULAR
  Filled 2023-06-03: qty 1

## 2023-06-03 NOTE — Discharge Instructions (Addendum)
Start taking your MiraLAX daily as we discussed.  Make sure you are drinking plenty of fluids which will also help with constipation.  I encourage you to make sure you are eating a healthy diet including plenty of fruits and vegetables and whole grains.

## 2023-06-03 NOTE — Telephone Encounter (Signed)
Pt's insurance denied Clindamycin 1% gel. Cash price was $38.24. Prescription changed to Clindamycin 1% solution 60 mL apply topically BID with 6 refills per JAG. Will be no copay. JSY

## 2023-06-03 NOTE — ED Provider Notes (Signed)
[place] EMERGENCY DEPARTMENT AT [facility] Provider Note   CSN: [id number] Arrival date & time: 06/03/23  1450     History  Chief Complaint  Patient presents with   Abdominal Pain    Brenda Benton is a 17 y.o. female presenting for evaluation of abdominal pain with radiation into her right flank which started yesterday evening.  She describes a sharp pain, it started out gradually and has worsened today, now is constant and is unrelieved by ibuprofen.  She denies nausea or vomiting, fevers or chills, denies dysuria or hematuria and has had no diarrhea.  She does not have bowel movements daily, but denies straining to go, her last bowel movement was 3 days ago.  Mother at the bedside states that as a young child she was diagnosed with abdominal migraines but has not had this symptom in years.  She is found no alleviators for symptoms.  The history is provided by the patient.       Home Medications Prior to Admission medications   Medication Sig Start Date End Date Taking? Authorizing Provider  polyethylene glycol (MIRALAX MIX-IN PAX) 17 g packet Take 17 g by mouth daily. 06/03/23  Yes Arvon Schreiner, Raynelle Fanning, PA-C  budesonide-formoterol (SYMBICORT) 160-4.5 MCG/ACT inhaler Inhale 2 puffs into the lungs 2 (two) times daily. 03/28/22   Alfonse Spruce, MD  clindamycin (CLINDAGEL) 1 % gel Apply topically 2 (two) times daily. 05/07/23   Adline Potter, NP  escitalopram (LEXAPRO) 10 MG tablet Take by mouth. 01/19/20   [provider]  fluticasone (FLOVENT HFA) 110 MCG/ACT inhaler Inhale 2 puffs into the lungs in the morning and at bedtime. 12/20/21   Johny Drilling, DO  norelgestromin-ethinyl estradiol Burr Medico) 150-35 MCG/24HR transdermal patch Place 1 patch onto the skin once a week. 02/06/23   Adline Potter, NP  Respiratory Therapy Supplies (VORTEX HOLDING CHAMBER/MASK) [place] Always use with inhaler to maximize drug delivery into the lungs. 09/20/21   Johny Drilling, DO       Allergies    Cephalexin and Other    Review of Systems   Review of Systems  Constitutional:  Negative for chills and fever.  HENT:  Negative for congestion.   Eyes: Negative.   Respiratory:  Negative for chest tightness and shortness of breath.   Cardiovascular:  Negative for chest pain.  Gastrointestinal:  Positive for abdominal pain. Negative for nausea and vomiting.  Genitourinary: Negative.   Musculoskeletal:  Negative for arthralgias, joint swelling and neck pain.  Skin: Negative.  Negative for rash and wound.  Neurological:  Negative for dizziness, weakness, light-headedness, numbness and headaches.  Psychiatric/Behavioral: Negative.    All other systems reviewed and are negative.   Physical Exam Updated Vital Signs BP 137/81   Pulse (!) 117   Temp 99 F (37.2 C)   Resp 20   Ht 5\' 3"  (1.6 m)   Wt 52.6 kg   LMP 04/16/2023 Comment: patch  SpO2 100%   BMI 20.55 kg/m  Physical Exam Vitals and nursing note reviewed.  Constitutional:      Appearance: She is well-developed.  HENT:     Head: Normocephalic and atraumatic.  Eyes:     Conjunctiva/sclera: Conjunctivae normal.  Cardiovascular:     Rate and Rhythm: Normal rate and regular rhythm.     Heart sounds: Normal heart sounds.  Pulmonary:     Effort: Pulmonary effort is normal.     Breath sounds: Normal breath sounds. No wheezing.  Abdominal:  General: Bowel sounds are normal.     Palpations: Abdomen is soft.     Tenderness: There is abdominal tenderness in the right upper quadrant, right lower quadrant and suprapubic area. There is no guarding or rebound.  Musculoskeletal:        General: Normal range of motion.     Cervical back: Normal range of motion.  Skin:    General: Skin is warm and dry.  Neurological:     Mental Status: She is alert.     ED Results / Procedures / Treatments   Labs (all labs ordered are listed, but only abnormal results are displayed) Labs Reviewed  COMPREHENSIVE  METABOLIC PANEL - Abnormal; Notable for the following components:      Result Value   Sodium 134 (*)    Potassium 3.2 (*)    Calcium 8.7 (*)    All other components within normal limits  URINALYSIS, ROUTINE W REFLEX MICROSCOPIC - Abnormal; Notable for the following components:   APPearance HAZY (*)    All other components within normal limits  POC URINE PREG, ED - Normal  LIPASE, BLOOD  CBC WITH DIFFERENTIAL/PLATELET    EKG None  Radiology CT ABDOMEN PELVIS W CONTRAST  Result Date: 06/03/2023 CLINICAL DATA:  Right side abdominal pain EXAM: CT ABDOMEN AND PELVIS WITH CONTRAST TECHNIQUE: Multidetector CT imaging of the abdomen and pelvis was performed using the standard protocol following bolus administration of intravenous contrast. RADIATION DOSE REDUCTION: This exam was performed according to the departmental dose-optimization program which includes automated exposure control, adjustment of the mA and/or kV according to patient size and/or use of iterative reconstruction technique. CONTRAST:  75mL OMNIPAQUE IOHEXOL 300 MG/ML  SOLN COMPARISON:  02/13/2023 FINDINGS: Lower chest: No acute abnormality Hepatobiliary: No focal hepatic abnormality. Gallbladder unremarkable. Pancreas: No focal abnormality or ductal dilatation. Spleen: No focal abnormality.  Normal size. Adrenals/Urinary Tract: No adrenal abnormality. No focal renal abnormality. No stones or hydronephrosis. Urinary bladder is unremarkable. Stomach/Bowel: Appendix not definitively seen. No pericecal inflammation. Stomach, large and small bowel grossly unremarkable. Vascular/Lymphatic: No evidence of aneurysm or adenopathy. Reproductive: Uterus and adnexa unremarkable.  No mass. Other: No free fluid or free air. Musculoskeletal: No acute bony abnormality. IMPRESSION: Appendix not visualized.  No pericecal inflammation. No acute findings. Electronically Signed   By: Charlett Nose M.D.   On: 06/03/2023 18:10   DG Abd Acute W/Chest  Result  Date: 06/03/2023 CLINICAL DATA:  Right lower abdominal pain radiating into the right flank EXAM: DG ABDOMEN ACUTE WITH 1 VIEW CHEST COMPARISON:  Chest radiograph dated 02/13/2023, CT abdomen and pelvis dated 02/13/2023 FINDINGS: Lines/tubes: None. Chest: Lungs are clear without focal consolidation. No pneumothorax or pleural effusion. Normal heart size. Abdomen: Nonobstructive bowel gas pattern. Moderate volume stool throughout the colon. No pneumatosis or free air. No abnormal calcification or mass effect. Bones: No acute osseous abnormality. Artifact related to the patient's clothing at the level of the pelvis results in suboptimal evaluation of this area. IMPRESSION: 1. Nonobstructive bowel gas pattern. Moderate volume stool throughout the colon. 2. Clear lungs.  Normal heart size. Electronically Signed   By: Agustin Cree M.D.   On: 06/03/2023 16:21    Procedures Procedures    Medications Ordered in ED Medications  iohexol (OMNIPAQUE) 300 MG/ML solution 75 mL (75 mLs Intravenous Contrast Given 06/03/23 1750)  ketorolac (TORADOL) 15 MG/ML injection 15 mg (15 mg Intravenous Given 06/03/23 1805)  hydrOXYzine (VISTARIL) injection 25 mg (25 mg Intramuscular Given 06/03/23 1835)  ED Course/ Medical Decision Making/ A&P                             Medical Decision Making Patient presenting with abdominal pain since yesterday evening, predominantly in the right upper quadrant with radiation into the right flank but also some discomfort in her right lower abdomen on exam.  No fevers, no nausea or vomiting.  She has a distant history of abdominal migraines.  She also has a distant history of constipation and has used MiraLAX in the past.  She states her last bowel movement was 3 days ago but does not feel she is constipated, stating she does not have to strain to have a bowel movement.  Her exam is reassuring without guarding or rebound.  No distention.  After her CT imaging, patient became very anxious,  tearful, afraid she was going to have to have a surgery.  She does have a history of anxiety, she states hydroxyzine has been helpful in the past, an IM dose of this was ordered.  Amount and/or Complexity of Data Reviewed Labs: ordered.    Details: Labs are normal essentially, she has a normal WBC count at 8.1, her c-Met reveals a mild hypokalemia at 3.2 otherwise unremarkable.  She has a normal lipase, she is not pregnant and she does not have a UTI. Radiology: ordered.    Details: CT imaging is negative for acute findings.  Although the appendix is not visualized, there is no pericecal inflammation, if she had acute appendicitis I would expect to be able to visualize this on CT imaging.  Risk Prescription drug management.           Final Clinical Impression(s) / ED Diagnoses Final diagnoses:  Abdominal pain, unspecified abdominal location  Constipation, unspecified constipation type    Rx / DC Orders ED Discharge Orders          Ordered    polyethylene glycol (MIRALAX MIX-IN PAX) 17 g packet  Daily        06/03/23 1839              Burgess Amor, Cordelia Poche 06/03/23 1841    Vanetta Mulders, MD 06/04/23 2115

## 2023-06-03 NOTE — ED Triage Notes (Signed)
Pt comes in with complaints of rt lower abdominal pain that radiates to the right flank starting last night. Pt denies fever, nausea, or vomiting. Per family as a child pt was dx "abdominal migraines."

## 2023-07-22 ENCOUNTER — Ambulatory Visit: Payer: Self-pay | Admitting: Surgery

## 2023-07-22 NOTE — H&P (Signed)
     Brenda Benton [id number]   Referring Provider:  Bing Ree   Subjective   Chief Complaint: New Consultation     History of Present Illness:    17 year old woman with history of anxiety, syncope and right-sided abdominal pain radiating to the back.  This is chronic with intermittent exacerbation, she notes is related to greasy foods.  This is associated with loss of appetite and nausea.  She had a HIDA scan done about 2 weeks ago that shows better ejection fraction of 31% (normal 33%).  She had a CT of the abdomen pelvis with contrast in early June that showed no abnormalities.  Also had CTs done in February of this year for similar symptoms.  Per chart review has a diagnosis of abdominal migraines when she was a child but had not had this issue in years.  Also has a history of constipation.    Review of Systems: A complete review of systems was obtained from the patient.  I have reviewed this information and discussed as appropriate with the patient.  See HPI as well for other ROS.   Medical History: Past Medical History:  Diagnosis Date   Anxiety    Asthma, unspecified asthma severity, unspecified whether complicated, unspecified whether persistent (HHS-HCC)     There is no problem list on file for this patient.   No past surgical history on file.   Allergies  Allergen Reactions   Cephalexin Nausea    Current Outpatient Medications on File Prior to Visit  Medication Sig Dispense Refill   escitalopram oxalate (LEXAPRO) 10 MG tablet Take 10 mg by mouth every morning     hydrOXYzine (ATARAX) 25 MG tablet Take 25 mg by mouth 3 (three) times daily as needed for Anxiety     No current facility-administered medications on file prior to visit.    No family history on file.   Social History   Tobacco Use  Smoking Status Never  Smokeless Tobacco Never     Social History   Socioeconomic History   Marital status: Single  Tobacco Use   Smoking status: Never    Smokeless tobacco: Never  Substance and Sexual Activity   Alcohol use: Not Currently   Drug use: Never    Objective:    Vitals:   07/22/23 1419  BP: 102/69  Pulse: 93  Temp: 36.7 C (98 F)  SpO2: 97%  Weight: 53.1 kg (117 lb)  Height: 160 cm (5\' 3" )    Body mass index is 20.73 kg/m.  Gen: A&Ox3, no distress  Labored respirations Abdomen is soft, nondistended, tender in the right upper quadrant  Assessment and Plan:  Diagnoses and all orders for this visit:  Biliary dyskinesia    Her symptoms are fairly classic and she does have a decreased gallbladder ejection fraction on HIDA. I recommend proceeding with laparoscopic or robotic cholecystectomy with possible cholangiogram.  Discussed the relevant anatomy using a diagram to demonstrate, and went over surgical technique.  Discussed risks of surgery including bleeding, pain, scarring, intraabdominal injury specifically to the common bile duct and sequelae, subtotal cholecystectomy, bile leak, conversion to open surgery, failure to resolve symptoms, blood clots/ pulmonary embolus, heart attack, pneumonia, stroke, death. Questions welcomed and answered to patient's satisfaction.  Patient wishes to proceed with surgery.   Ikey Omary Carlye Grippe, MD

## 2023-08-07 ENCOUNTER — Ambulatory Visit: Payer: MEDICAID | Admitting: Adult Health

## 2023-08-11 ENCOUNTER — Other Ambulatory Visit: Payer: Self-pay

## 2023-08-11 ENCOUNTER — Encounter (HOSPITAL_COMMUNITY): Payer: Self-pay | Admitting: Surgery

## 2023-08-11 NOTE — Progress Notes (Signed)
Brenda Benton denies chest pain or shortness of breath.  Brenda Benton reports that she and her family tested positive for Covid on 07/22/23, patients symptoms were migraine, fever, chills, body aches, patient said she felt better, but had chills on 07/25/23.  Patient said that she took a home test and then was tested at [facility] in [place], it was positive.Brenda. Benton did another Covid test on 07/25/23, it was negative.  Patient states that she feels fine now.  Brenda Benton states that she has been having pseudo seizures, they started 2 months ago,  Patient said that her face gets hot, then she feels like she is going to pass out and when she opens her eyes she is on the floor and there are several people around her. Patient did not go to the ED, EMS checked her out 1 time.  Brenda Benton reports that the last one was the worst, it lasted 3 minutes and folks around her witnessed hard jerking and patient's eyes rolled back in her head. Brenda Benton states that she has seen her PCP, Denny Peon, PA-C at [facility]. PCP has made a referral , patient does not know where, her mother keeps up with her appointments.

## 2023-08-12 ENCOUNTER — Encounter (HOSPITAL_COMMUNITY): Payer: Self-pay | Admitting: Vascular Surgery

## 2023-08-12 NOTE — Progress Notes (Signed)
Anesthesia Chart Review: Brenda Benton   Case: [id number] Date/Time: 08/13/23 0715   Procedure: LAPAROSCOPIC CHOLECYSTECTOMY   Anesthesia type: General   Pre-op diagnosis: BILIARY DYSKINESIA   Location: [facility] ROOM [facility] / [facility]   Surgeons: Berna Bue, MD       DISCUSSION: Patient is a 17 year old scheduled for the procedure.  History includes never smoker (with passive smoke exposure), anxiety, depression, IBS, asthma, abdominal migraines. TSH was elevated on 02/13/23 labs in [facility] records/labs are not currently available.)  RN notation by Victoriano Lain, RN reviewed. Patient +COVID-19 on 07/22/23. By notes, she has been recovered for at least 2 weeks.   She does have a pending neurology evaluation by Windell Norfolk, MD on 09/16/13 at [facility]. She was referred due to multiple episodes of feeling flushed/hot then with presyncope or syncope. Episodes started within the past few months. She described one episode where it sounds like there was syncope with jerking and eyes rolled back. Her PCP Lianne Moris, PA-C referred her to neurology. Patient described events as "pseudoseizures", although I don't believe she has received a formal diagnosis.   LFTs normal per 06/03/23 labs. Discussed with anesthesiologist Leslye Peer, MD. Given her new symptoms of possible seizures, would recommend postponing surgery until she can be cleared by neurology and additional testing/work-up done per their recommendations. I have notified [facility] triage nurse Tresa Endo.   VS: Ht 5\' 3"  (1.6 m)   Wt 53.5 kg   LMP 07/30/2023   BMI 20.90 kg/m  BP Readings from Last 3 Encounters:  06/03/23 107/65  05/07/23 98/63  02/13/23 (!) 108/59   Pulse Readings from Last 3 Encounters:  06/03/23 66  05/07/23 65  02/13/23 70     PROVIDERS: Practice, [facility] is PCP   LABS: Most recent lab results in [facility] include: Lab Results  Component Value Date   WBC 8.1 06/03/2023    HGB 12.3 06/03/2023   HCT 36.6 06/03/2023   PLT 387 06/03/2023   GLUCOSE 93 06/03/2023   CHOL 138 09/20/2020   TRIG 72 09/20/2020   HDL 55 09/20/2020   LDLCALC 69 09/20/2020   ALT 12 06/03/2023   AST 17 06/03/2023   NA 134 (L) 06/03/2023   K 3.2 (L) 06/03/2023   CL 103 06/03/2023   CREATININE 0.62 06/03/2023   BUN 10 06/03/2023   CO2 25 06/03/2023   TSH 6.990 (H) 02/13/2023    IMAGES: CT Abd/pelvis 06/03/23: IMPRESSION: Appendix not visualized.  No pericecal inflammation. No acute findings.   EKG: 02/13/23:  Sinus rhythm Abnormal Q suggests anterior infarct Borderline T abnormalities, anterior leads Confirmed by Rochele Raring [phone]) on 02/13/2023 5:24:06 AM   CV: N/A  Past Medical History:  Diagnosis Date   Abdominal migraine    dx age 1   Allergic rhinitis    Anxiety    Asthma    Constipation    Depression    Irritable bowel syndrome    Otitis media     Past Surgical History:  Procedure Laterality Date   WISDOM TOOTH EXTRACTION      MEDICATIONS: No current facility-administered medications for this encounter.    albuterol (VENTOLIN HFA) 108 (90 Base) MCG/ACT inhaler   budesonide-formoterol (SYMBICORT) 160-4.5 MCG/ACT inhaler   escitalopram (LEXAPRO) 10 MG tablet   hydrOXYzine (ATARAX) 25 MG tablet   polyethylene glycol (MIRALAX MIX-IN PAX) 17 g packet   Respiratory Therapy Supplies (VORTEX HOLDING CHAMBER/MASK) DEVI    Shonna Chock, PA-C  Surgical Short Stay/Anesthesiology [facility] Phone [phone] [facility] Phone [phone] 08/12/2023 11:11 AM

## 2023-08-13 ENCOUNTER — Ambulatory Visit (HOSPITAL_COMMUNITY): Admission: RE | Admit: 2023-08-13 | Payer: MEDICAID | Source: Home / Self Care | Admitting: Surgery

## 2023-08-13 SURGERY — LAPAROSCOPIC CHOLECYSTECTOMY
Anesthesia: General

## 2023-09-17 ENCOUNTER — Encounter: Payer: Self-pay | Admitting: Neurology

## 2023-09-17 ENCOUNTER — Ambulatory Visit: Payer: PRIVATE HEALTH INSURANCE | Admitting: Neurology

## 2023-11-11 ENCOUNTER — Other Ambulatory Visit: Payer: MEDICAID

## 2023-11-28 ENCOUNTER — Encounter (HOSPITAL_COMMUNITY): Payer: Self-pay | Admitting: Emergency Medicine

## 2023-11-28 ENCOUNTER — Emergency Department (HOSPITAL_COMMUNITY)
Admission: EM | Admit: 2023-11-28 | Discharge: 2023-11-28 | Disposition: A | Discharge status: Home / Self Care | Payer: MEDICAID | Attending: Emergency Medicine | Admitting: Emergency Medicine

## 2023-11-28 ENCOUNTER — Other Ambulatory Visit: Payer: Self-pay

## 2023-11-28 DIAGNOSIS — K8050 Calculus of bile duct without cholangitis or cholecystitis without obstruction: Secondary | ICD-10-CM | POA: Insufficient documentation

## 2023-11-28 DIAGNOSIS — R109 Unspecified abdominal pain: Secondary | ICD-10-CM | POA: Diagnosis present

## 2023-11-28 DIAGNOSIS — J45909 Unspecified asthma, uncomplicated: Secondary | ICD-10-CM | POA: Diagnosis not present

## 2023-11-28 LAB — COMPREHENSIVE METABOLIC PANEL
ALT: 12 U/L (ref 0–44)
AST: 16 U/L (ref 15–41)
Albumin: 4.1 g/dL (ref 3.5–5.0)
Alkaline Phosphatase: 50 U/L (ref 38–126)
Anion gap: 9 (ref 5–15)
BUN: 16 mg/dL (ref 6–20)
CO2: 22 mmol/L (ref 22–32)
Calcium: 9 mg/dL (ref 8.9–10.3)
Chloride: 106 mmol/L (ref 98–111)
Creatinine, Ser: 0.65 mg/dL (ref 0.44–1.00)
GFR, Estimated: >60 mL/min (ref >60)
Glucose, Bld: 101 mg/dL — ABNORMAL HIGH (ref 70–99)
Potassium: 3.5 mmol/L (ref 3.5–5.1)
Sodium: 137 mmol/L (ref 135–145)
Total Bilirubin: 0.6 mg/dL (ref <1.2)
Total Protein: 7.2 g/dL (ref 6.5–8.1)

## 2023-11-28 LAB — CBC
HCT: 37.6 % (ref 36.0–46.0)
Hemoglobin: 12.5 g/dL (ref 12.0–15.0)
MCH: 29.6 pg (ref 26.0–34.0)
MCHC: 33.2 g/dL (ref 30.0–36.0)
MCV: 89.1 fL (ref 80.0–100.0)
Platelets: 413 10*3/uL — ABNORMAL HIGH (ref 150–400)
RBC: 4.22 MIL/uL (ref 3.87–5.11)
RDW: 12.1 % (ref 11.5–15.5)
WBC: 9.4 10*3/uL (ref 4.0–10.5)
nRBC: 0 % (ref 0.0–0.2)

## 2023-11-28 LAB — LIPASE, BLOOD: Lipase: 35 U/L (ref 11–51)

## 2023-11-28 LAB — HCG, QUANTITATIVE, PREGNANCY: hCG, Beta Chain, Quant, S: <1 m[IU]/mL (ref <5)

## 2023-11-28 MED ORDER — DICYCLOMINE HCL 20 MG PO TABS: 20.0000 mg | ORAL_TABLET | Freq: Two times a day (BID) | ORAL | 0 refills | Status: DC

## 2023-11-28 MED ORDER — ONDANSETRON HCL 4 MG/2ML IJ SOLN
4.0000 mg | Freq: Once | INTRAMUSCULAR | Status: DC
  Filled 2023-11-28: qty 2

## 2023-11-28 MED ORDER — ONDANSETRON 4 MG PO TBDP: 4.0000 mg | ORAL_TABLET | Freq: Three times a day (TID) | ORAL | 0 refills | Status: DC | PRN

## 2023-11-28 NOTE — ED Triage Notes (Signed)
Pt with c/o R sided abdominal pain that started PTA. Pt states she "has been having trouble with her gallbladder".

## 2023-11-28 NOTE — ED Provider Notes (Incomplete)
[place] EMERGENCY DEPARTMENT AT [facility] Provider Note   CSN: [id number] Arrival date & time: 11/28/23  2105     History {Add pertinent medical, surgical, social history, OB history to HPI:1} Chief Complaint  Patient presents with   Abdominal Pain    Brenda Benton is a 17 y.o. female with PMH as listed below who presents c/o R sided abdominal pain that started PTA. Pt states she "has been having trouble with her gallbladder". *** Per chart review she was scheduled for a cholecystectomy in August 2024 but it was postponed due to concern for possible new onset seizures that have been described as "pseudoseizures."  Past Medical History:  Diagnosis Date   Abdominal migraine    dx age 68   Allergic rhinitis    Anxiety    Asthma    Constipation    Depression    Irritable bowel syndrome    Otitis media        Home Medications Prior to Admission medications   Medication Sig Start Date End Date Taking? Authorizing Provider  albuterol (VENTOLIN HFA) 108 (90 Base) MCG/ACT inhaler Inhale 1-2 puffs into the lungs every 6 (six) hours as needed for wheezing or shortness of breath.    [provider]  budesonide-formoterol (SYMBICORT) 160-4.5 MCG/ACT inhaler Inhale 2 puffs into the lungs 2 (two) times daily as needed (shortness of breath).    [provider]  escitalopram (LEXAPRO) 10 MG tablet Take 10 mg by mouth in the morning. 01/19/20   [provider]  hydrOXYzine (ATARAX) 25 MG tablet Take 25 mg by mouth 3 (three) times daily as needed for anxiety.    [provider]  polyethylene glycol (MIRALAX MIX-IN PAX) 17 g packet Take 17 g by mouth daily. Patient not taking: Reported on 08/07/2023 06/03/23   Burgess Amor, PA-C  Respiratory Therapy Supplies (VORTEX HOLDING CHAMBER/MASK) DEVI Always use with inhaler to maximize drug delivery into the lungs. 09/20/21   Johny Drilling, DO      Allergies    Cephalexin and Other    Review of  Systems   Review of Systems A 10 point review of systems was performed and is negative unless otherwise reported in HPI.  Physical Exam Updated Vital Signs BP 134/76   Pulse (!) 112   Temp 98.6 F (37 C)   Resp 18   Ht 5\' 3"  (1.6 m)   Wt 58.1 kg   LMP 11/08/2023 (Approximate)   SpO2 98%   BMI 22.67 kg/m  Physical Exam General: Normal appearing {Desc; female/female:11659}, lying in bed.  HEENT: PERRLA, Sclera anicteric, MMM, trachea midline.  Cardiology: RRR, no murmurs/rubs/gallops. BL radial and DP pulses equal bilaterally.  Resp: Normal respiratory rate and effort. CTAB, no wheezes, rhonchi, crackles.  Abd: Soft, non-tender, non-distended. No rebound tenderness or guarding.  GU: Deferred. MSK: No peripheral edema or signs of trauma. Extremities without deformity or TTP. No cyanosis or clubbing. Skin: warm, dry. No rashes or lesions. Back: No CVA tenderness Neuro: A&Ox4, CNs II-XII grossly intact. MAEs. Sensation grossly intact.  Psych: Normal mood and affect.   ED Results / Procedures / Treatments   Labs (all labs ordered are listed, but only abnormal results are displayed) Labs Reviewed  LIPASE, BLOOD  COMPREHENSIVE METABOLIC PANEL  CBC  URINALYSIS, ROUTINE W REFLEX MICROSCOPIC  POC URINE PREG, ED    EKG None  Radiology No results found.  Procedures Procedures  {Document cardiac monitor, telemetry assessment procedure when appropriate:1}  Medications  Ordered in ED Medications - No data to display  ED Course/ Medical Decision Making/ A&P                          Medical Decision Making Amount and/or Complexity of Data Reviewed Labs: ordered.    This patient presents to the ED for concern of ***, this involves an extensive number of treatment options, and is a complaint that carries with it a high risk of complications and morbidity.  I considered the following differential and admission for this acute, potentially life threatening condition.   MDM:     ***     Labs: I Ordered, and personally interpreted labs.  The pertinent results include:  ***  Imaging Studies ordered: I ordered imaging studies including *** I independently visualized and interpreted imaging. I agree with the radiologist interpretation  Additional history obtained from ***.  External records from outside source obtained and reviewed including ***  Cardiac Monitoring: The patient was maintained on a cardiac monitor.  I personally viewed and interpreted the cardiac monitored which showed an underlying rhythm of: ***  Reevaluation: After the interventions noted above, I reevaluated the patient and found that they have :{resolved/improved/worsened:23923::"improved"}  Social Determinants of Health: ***  Disposition:  ***  Co morbidities that complicate the patient evaluation  Past Medical History:  Diagnosis Date   Abdominal migraine    dx age 80   Allergic rhinitis    Anxiety    Asthma    Constipation    Depression    Irritable bowel syndrome    Otitis media      Medicines No orders of the defined types were placed in this encounter.   I have reviewed the patients home medicines and have made adjustments as needed  Problem List / ED Course: Problem List Items Addressed This Visit   None        {Document critical care time when appropriate:1} {Document review of labs and clinical decision tools ie heart score, Chads2Vasc2 etc:1}  {Document your independent review of radiology images, and any outside records:1} {Document your discussion with family members, caretakers, and with consultants:1} {Document social determinants of health affecting pt's care:1} {Document your decision making why or why not admission, treatments were needed:1}  This note was created using dictation software, which may contain spelling or grammatical errors.

## 2023-11-28 NOTE — Discharge Instructions (Addendum)
Thank you for coming to [facility] Emergency Department. You were seen for abdominal pain that improved. We did an exam, labs, and these showed no acute findings. Please follow up with your primary care provider within 1 week.  We have prescribed Zofran 4 mg under the tongue which you can take every 6-8 hours as needed for nausea vomiting.  Please table hydrate at home.  Please avoid fatty foods.  You can also take Bentyl 20 mg twice per day as needed for abdominal cramping.  Do not hesitate to return to the ED or call 911 if you experience: -Worsening symptoms -Nausea vomiting so severe you cannot eat or drink anything -Lightheadedness, passing out -Fevers/chills -Anything else that concerns you

## 2023-11-28 NOTE — ED Notes (Signed)
ED Provider at bedside. 

## 2023-11-28 NOTE — ED Notes (Signed)
Pt aware urine sample Is needed

## 2024-02-02 ENCOUNTER — Ambulatory Visit: Payer: MEDICAID | Admitting: Adult Health

## 2024-04-12 ENCOUNTER — Encounter: Payer: Self-pay | Admitting: Neurology

## 2024-04-12 ENCOUNTER — Ambulatory Visit (INDEPENDENT_AMBULATORY_CARE_PROVIDER_SITE_OTHER): Payer: MEDICAID | Admitting: Neurology

## 2024-04-12 VITALS — BP 114/75 | HR 76

## 2024-04-12 DIAGNOSIS — R55 Syncope and collapse: Secondary | ICD-10-CM | POA: Diagnosis not present

## 2024-04-12 NOTE — Patient Instructions (Signed)
 Routine EEG, I will contact you to go over the results  Please follow up with your psychiatrist for management of Depression and Anxiety  Continue to follow up with PCP  Return as needed

## 2024-04-12 NOTE — Progress Notes (Signed)
 [facility]  PATIENT: Brenda Benton DOB: 12-19-2006  REQUESTING CLINICIAN: Lianne Moris, PA-C HISTORY FROM: Patient and mother  REASON FOR VISIT: Syncope    HISTORICAL  CHIEF COMPLAINT:  Chief Complaint  Patient presents with   New Patient (Initial Visit)    Rm12, mother present,  referral for multiple episodes of syncope, dizziness, paresthesias/Erin Noralyn Pick, [facility]: face goes numb, gets hot. Faints, gets shaky in hands and legs prior to fainting, last episode 2 nights ago. Orthostatic bp completed     HISTORY OF PRESENT ILLNESS:  This is a 18 year old woman past medical history anxiety/depression, asthma who is presenting with events consisting of passing out for the past 10 months.  Patient reports that her first episode started July of last year, she was at work, then started feeling right-sided face tingling, right arm feeling warm and left arm numbness before passing out.  She was told it was related to stress associated with her job. She stopped working but continued to have events, last episode was 2 days ago.  Most of the events now are associated with pain associated with her gallbladder.  She denies any injury, denies any tongue biting, no urinary incontinence and no postictal confusion, she usually has palpitations prior to the events. Mother has witnessed some of her events, tells me that her eyes sometime go in the back of her head, she is limp and the episode lasted less than a minute.  She is back to her normal state after that.  Patient does have a cousin with epilepsy and mother tells me that her events are totally different from the epileptic seizures that she knows about.  At one point she was told that she is having nonepileptic spells. Patient has a long history of anxiety and depression, has been on multiple antidepressants and last year she self discontinued all of her medications, this was the time when these events started.   OTHER  MEDICAL CONDITIONS: Anxiety/Depression, Asthma    REVIEW OF SYSTEMS: Full 14 system review of systems performed and negative with exception of: As noted in the HPI   ALLERGIES: Allergies  Allergen Reactions   Cephalexin Nausea Only   Other Hives and Other (See Comments)    Dust mites, mold-itchy, watery eyes    HOME MEDICATIONS: Outpatient Medications Prior to Visit  Medication Sig Dispense Refill   albuterol (VENTOLIN HFA) 108 (90 Base) MCG/ACT inhaler Inhale 1-2 puffs into the lungs every 6 (six) hours as needed for wheezing or shortness of breath.     budesonide-formoterol (SYMBICORT) 160-4.5 MCG/ACT inhaler Inhale 2 puffs into the lungs 2 (two) times daily as needed (shortness of breath).     Respiratory Therapy Supplies (VORTEX HOLDING CHAMBER/MASK) DEVI Always use with inhaler to maximize drug delivery into the lungs. 2 each 1   dicyclomine (BENTYL) 20 MG tablet Take 1 tablet (20 mg total) by mouth 2 (two) times daily. 20 tablet 0   escitalopram (LEXAPRO) 10 MG tablet Take 10 mg by mouth in the morning.     hydrOXYzine (ATARAX) 25 MG tablet Take 25 mg by mouth 3 (three) times daily as needed for anxiety.     ondansetron (ZOFRAN-ODT) 4 MG disintegrating tablet Take 1 tablet (4 mg total) by mouth every 8 (eight) hours as needed. 20 tablet 0   polyethylene glycol (MIRALAX MIX-IN PAX) 17 g packet Take 17 g by mouth daily. (Patient not taking: Reported on 08/07/2023) 14 each 3   No facility-administered medications prior to  visit.    PAST MEDICAL HISTORY: Past Medical History:  Diagnosis Date   Abdominal migraine    dx age 14   Allergic rhinitis    Anxiety    Asthma    Constipation    Depression    Irritable bowel syndrome    Otitis media     PAST SURGICAL HISTORY: Past Surgical History:  Procedure Laterality Date   WISDOM TOOTH EXTRACTION      FAMILY HISTORY: Family History  Problem Relation Age of Onset   Asthma Brother    Asthma Brother    Asthma Brother     Anxiety disorder Maternal Grandfather    Depression Maternal Grandfather    Cancer Paternal Grandmother     SOCIAL HISTORY: Social History   Socioeconomic History   Marital status: Single    Spouse name: Not on file   Number of children: 0   Years of education: Not on file   Highest education level: Not on file  Occupational History   Not on file  Tobacco Use   Smoking status: Never    Passive exposure: Current   Smokeless tobacco: Never  Vaping Use   Vaping status: Some Days   Substances: Nicotine  Substance and Sexual Activity   Alcohol use: Never   Drug use: Never    Comment: tried x 1   Sexual activity: Yes    Birth control/protection: Condom, Patch  Other Topics Concern   Not on file  Social History Narrative   Not on file   Social Drivers of Health   Financial Resource Strain: Not on file  Food Insecurity: Not on file  Transportation Needs: Not on file  Physical Activity: Not on file  Stress: Not on file  Social Connections: Not on file  Intimate Partner Violence: Not on file    PHYSICAL EXAM  GENERAL EXAM/CONSTITUTIONAL: Vitals:  Vitals:   04/12/24 0815 04/12/24 0817 04/12/24 0818  BP: 110/72 103/67 114/75  Pulse: 76 76 76   There is no height or weight on file to calculate BMI. Wt Readings from Last 3 Encounters:  11/28/23 128 lb (58.1 kg) (51%, Z= 0.03)*  06/03/23 116 lb (52.6 kg) (29%, Z= -0.56)*  05/07/23 120 lb 8 oz (54.7 kg) (39%, Z= -0.29)*   * Growth percentiles are based on CDC (Girls, 2-20 Years) data.   Patient is in no distress; well developed, nourished and groomed; neck is supple  MUSCULOSKELETAL: Gait, strength, tone, movements noted in Neurologic exam below  NEUROLOGIC: MENTAL STATUS:      No data to display         awake, alert, oriented to person, place and time recent and remote memory intact normal attention and concentration language fluent, comprehension intact, naming intact fund of knowledge  appropriate  CRANIAL NERVE:  2nd, 3rd, 4th, 6th - Visual fields full to confrontation, extraocular muscles intact, no nystagmus 5th - facial sensation symmetric 7th - facial strength symmetric 8th - hearing intact 9th - palate elevates symmetrically, uvula midline 11th - shoulder shrug symmetric 12th - tongue protrusion midline  MOTOR:  normal bulk and tone, full strength in the BUE, BLE  SENSORY:  normal and symmetric to light touch  COORDINATION:  finger-nose-finger, fine finger movements normal  REFLEXES:  deep tendon reflexes present and symmetric  GAIT/STATION:  normal   DIAGNOSTIC DATA (LABS, IMAGING, TESTING) - I reviewed patient records, labs, notes, testing and imaging myself where available.  Lab Results  Component Value Date   WBC 9.4  11/28/2023   HGB 12.5 11/28/2023   HCT 37.6 11/28/2023   MCV 89.1 11/28/2023   PLT 413 (H) 11/28/2023      Component Value Date/Time   NA 137 11/28/2023 2151   K 3.5 11/28/2023 2151   CL 106 11/28/2023 2151   CO2 22 11/28/2023 2151   GLUCOSE 101 (H) 11/28/2023 2151   BUN 16 11/28/2023 2151   CREATININE 0.65 11/28/2023 2151   CALCIUM 9.0 11/28/2023 2151   PROT 7.2 11/28/2023 2151   PROT 7.1 10/10/2019 0953   ALBUMIN 4.1 11/28/2023 2151   ALBUMIN 4.3 10/10/2019 0953   AST 16 11/28/2023 2151   ALT 12 11/28/2023 2151   ALKPHOS 50 11/28/2023 2151   BILITOT 0.6 11/28/2023 2151   BILITOT 0.4 10/10/2019 0953   GFRNONAA >60 11/28/2023 2151   GFRAA NOT CALCULATED 09/20/2020 1845   Lab Results  Component Value Date   CHOL 138 09/20/2020   HDL 55 09/20/2020   LDLCALC 69 09/20/2020   TRIG 72 09/20/2020   CHOLHDL 2.5 09/20/2020   Lab Results  Component Value Date   HGBA1C 5.3 09/20/2020   No results found for: "VITAMINB12" Lab Results  Component Value Date   TSH 6.990 (H) 02/13/2023     ASSESSMENT AND PLAN  17 y.o. year old female with anxiety depression, asthma who is presenting for events described as right  face tingling, right arm warmness, left arm numbness, palpitations prior to passing out.  These events usually last less than a minute, no postictal confusion, no injuries. She does have a history of syncope associated with pain and blood drawn.  I have told patient that I suspect these events are vasovagal syncope associated with increased stress.  She has since discontinued all of her antidepressant and antianxiolytic.  Advised her to continue following up with psychiatrist for management of ongoing anxiety and depression, we will obtain a routine EEG.  I will contact patient to go over the result.  Continue to follow with PCP and return as needed.   1. Vasovagal syncope      Patient Instructions  Routine EEG, I will contact you to go over the results  Please follow up with your psychiatrist for management of Depression and Anxiety  Continue to follow up with PCP  Return as needed   Orders Placed This Encounter  Procedures   EEG adult    No orders of the defined types were placed in this encounter.   Return if symptoms worsen or fail to improve.    Cassandra Cleveland, MD 04/12/2024, 8:58 AM  [facility] [address] [place], [place] [address] [phone]

## 2024-04-14 ENCOUNTER — Other Ambulatory Visit: Payer: MEDICAID | Admitting: *Deleted

## 2024-04-19 ENCOUNTER — Ambulatory Visit (INDEPENDENT_AMBULATORY_CARE_PROVIDER_SITE_OTHER): Payer: MEDICAID | Admitting: Neurology

## 2024-04-19 DIAGNOSIS — R55 Syncope and collapse: Secondary | ICD-10-CM | POA: Diagnosis not present

## 2024-04-19 NOTE — Procedures (Signed)
    History:  18 year old woman with syncope   EEG classification: Awake and drowsy  Duration: 25 minutes   Technical aspects: This EEG study was done with scalp electrodes positioned according to the 10-20 International system of electrode placement. Electrical activity was reviewed with band pass filter of 1-70Hz , sensitivity of 7 uV/mm, display speed of 74mm/sec with a 60Hz  notched filter applied as appropriate. EEG data were recorded continuously and digitally stored.   Description of the recording: The background rhythms of this recording consists of a fairly well modulated medium amplitude alpha rhythm of 9 Hz that is reactive to eye opening and closure. Present in the anterior head region is a 15-20 Hz beta activity. Photic stimulation was performed, did not show any abnormalities. Hyperventilation was also performed, did not show any abnormalities. Drowsiness was manifested by background fragmentation. No abnormal epileptiform discharges seen during this recording. There was no focal slowing. There were no electrographic seizure identified.   Abnormality: None   Impression: This is a normal awake and drowsy EEG. No evidence of interictal epileptiform discharges. Normal EEGs, however, do not rule out epilepsy.    Kandyce Dieguez, MD [facility]

## 2024-04-20 ENCOUNTER — Encounter: Payer: Self-pay | Admitting: Neurology

## 2024-04-20 NOTE — Telephone Encounter (Signed)
 FYI-Pt's mother could not get in to the mychart to read provider's message. I read message to her and she verbalized appreciation.

## 2024-06-02 ENCOUNTER — Ambulatory Visit: Payer: Self-pay | Admitting: Surgery

## 2024-06-02 NOTE — H&P (View-Only) (Signed)
 Brenda Benton [id number]   Referring Provider:  Self   Subjective   Chief Complaint: Follow-up     History of Present Illness:  Patient returns to discuss cholecystectomy.  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 Jiles Mote PA Ella Gun) required neurology clearance before proceeding with surgery.  Since that time she has had at least 3 returns to the emergency department right upper quadrant pain.  Endorses an episode of pain just 3 days ago as well.  She was ultimately seen by [facility] neurology on 4/15 and diagnosed with vasovagal syncope.  EEG was normal.  July 5458: 18 year old woman with history of anxiety, syncope and right-sided abdominal pain radiating to the back.  This is chronic with intermittent exacerbation, she notes is related to greasy foods.  This is associated with loss of appetite and nausea.  She had a HIDA scan done about 2 weeks ago that shows  ejection fraction of 31% (normal 33%).  She had a CT of the abdomen pelvis with contrast in early June that showed no abnormalities.  Also had CTs done in February of this year for similar symptoms.  Per chart review has a diagnosis of abdominal migraines when she was a child but had not had this issue in years.  Also has a history of constipation.    Review of Systems: A complete review of systems was obtained from the patient.  I have reviewed this information and discussed as appropriate with the patient.  See HPI as well for other ROS.   Medical History: Past Medical History:  Diagnosis Date   Anxiety    Asthma, unspecified asthma severity, unspecified whether complicated, unspecified whether persistent (HHS-HCC)     There is no problem list on file for this patient.   History reviewed. No pertinent surgical history.   Allergies  Allergen Reactions    Cephalexin  Nausea    Current Outpatient Medications on File Prior to Visit  Medication Sig Dispense Refill   ibuprofen (MOTRIN) 200 MG tablet Take 200 mg by mouth every 6 (six) hours as needed for Pain     escitalopram  oxalate (LEXAPRO ) 10 MG tablet Take 10 mg by mouth every morning (Patient not taking: Reported on 06/02/2024)     hydrOXYzine  (ATARAX ) 25 MG tablet Take 25 mg by mouth 3 (three) times daily as needed for Anxiety (Patient not taking: Reported on 06/02/2024)     No current facility-administered medications on file prior to visit.    History reviewed. No pertinent family history.   Social History   Tobacco Use  Smoking Status Never  Smokeless Tobacco Never     Social History   Socioeconomic History   Marital status: Single  Tobacco Use   Smoking status: Never   Smokeless tobacco: Never  Substance and Sexual Activity   Alcohol use: Not Currently   Drug use: Never    Objective:    Vitals:   06/02/24 0954  Weight: 55.3 kg (122 lb)  PainSc:   2  PainLoc: Abdomen     Body mass index is 21.61 kg/m.  Gen: A&Ox3, no distress  Labored respirations Abdomen is soft, nondistended, tender in the right upper quadrant  Assessment and Plan:  Diagnoses and all orders for this visit:  Biliary dyskinesia     Her symptoms are fairly classic and she does have a decreased  gallbladder ejection fraction on HIDA. I again recommend proceeding with laparoscopic cholecystectomy with possible cholangiogram.  Reviewed the relevant anatomy using a diagram to demonstrate, and went over surgical technique.  Discussed risks of surgery including bleeding, pain, scarring, intraabdominal injury specifically to the common bile duct and sequelae, subtotal cholecystectomy, bile leak, postcholecystectomy diarrhea, conversion to open surgery, failure to resolve symptoms, blood clots/ pulmonary embolus, heart attack, pneumonia, stroke, etc. Questions welcomed and answered to patient's  satisfaction.  Patient wishes to proceed with surgery.   [name] [name], MD

## 2024-06-02 NOTE — H&P (Signed)
 Brenda Benton [id number]   Referring Provider:  Self   Subjective   Chief Complaint: Follow-up     History of Present Illness:  Patient returns to discuss cholecystectomy.  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 Jiles Mote PA Ella Gun) required neurology clearance before proceeding with surgery.  Since that time she has had at least 3 returns to the emergency department right upper quadrant pain.  Endorses an episode of pain just 3 days ago as well.  She was ultimately seen by [facility] neurology on 4/15 and diagnosed with vasovagal syncope.  EEG was normal.  Brenda Benton: 18 year old woman with history of anxiety, syncope and right-sided abdominal pain radiating to the back.  This is chronic with intermittent exacerbation, she notes is related to greasy foods.  This is associated with loss of appetite and nausea.  She had a HIDA scan done about 2 weeks ago that shows  ejection fraction of 31% (normal 33%).  She had a CT of the abdomen pelvis with contrast in early June that showed no abnormalities.  Also had CTs done in February of this year for similar symptoms.  Per chart review has a diagnosis of abdominal migraines when she was a child but had not had this issue in years.  Also has a history of constipation.    Review of Systems: A complete review of systems was obtained from the patient.  I have reviewed this information and discussed as appropriate with the patient.  See HPI as well for other ROS.   Medical History: Past Medical History:  Diagnosis Date   Anxiety    Asthma, unspecified asthma severity, unspecified whether complicated, unspecified whether persistent (HHS-HCC)     There is no problem list on file for this patient.   History reviewed. No pertinent surgical history.   Allergies  Allergen Reactions    Cephalexin  Nausea    Current Outpatient Medications on File Prior to Visit  Medication Sig Dispense Refill   ibuprofen (MOTRIN) 200 MG tablet Take 200 mg by mouth every 6 (six) hours as needed for Pain     escitalopram  oxalate (LEXAPRO ) 10 MG tablet Take 10 mg by mouth every morning (Patient not taking: Reported on 06/02/2024)     hydrOXYzine  (ATARAX ) 25 MG tablet Take 25 mg by mouth 3 (three) times daily as needed for Anxiety (Patient not taking: Reported on 06/02/2024)     No current facility-administered medications on file prior to visit.    History reviewed. No pertinent family history.   Social History   Tobacco Use  Smoking Status Never  Smokeless Tobacco Never     Social History   Socioeconomic History   Marital status: Single  Tobacco Use   Smoking status: Never   Smokeless tobacco: Never  Substance and Sexual Activity   Alcohol use: Not Currently   Drug use: Never    Objective:    Vitals:   06/02/24 0954  Weight: 55.3 kg (122 lb)  PainSc:   2  PainLoc: Abdomen     Body mass index is 21.61 kg/m.  Gen: A&Ox3, no distress  Labored respirations Abdomen is soft, nondistended, tender in the right upper quadrant  Assessment and Plan:  Diagnoses and all orders for this visit:  Biliary dyskinesia     Her symptoms are fairly classic and she does have a decreased  gallbladder ejection fraction on HIDA. I again recommend proceeding with laparoscopic cholecystectomy with possible cholangiogram.  Reviewed the relevant anatomy using a diagram to demonstrate, and went over surgical technique.  Discussed risks of surgery including bleeding, pain, scarring, intraabdominal injury specifically to the common bile duct and sequelae, subtotal cholecystectomy, bile leak, postcholecystectomy diarrhea, conversion to open surgery, failure to resolve symptoms, blood clots/ pulmonary embolus, heart attack, pneumonia, stroke, etc. Questions welcomed and answered to patient's  satisfaction.  Patient wishes to proceed with surgery.   Brenda Benton Brenda Cuba, MD

## 2024-06-07 NOTE — Patient Instructions (Signed)
 SURGICAL WAITING ROOM VISITATION  Patients having surgery or a procedure may have no more than 2 support people in the waiting area - these visitors may rotate.    Children under the age of 2 must have an adult with them who is not the patient.  Visitors with respiratory illnesses are discouraged from visiting and should remain at home.  If the patient needs to stay at the hospital during part of their recovery, the visitor guidelines for inpatient rooms apply. Pre-op nurse will coordinate an appropriate time for 1 support person to accompany patient in pre-op.  This support person may not rotate.    Please refer to the [facility] website for the visitor guidelines for Inpatients (after your surgery is over and you are in a regular room).       Your procedure is scheduled on: 06/10/24   Report to [facility] Main Entrance    Report to admitting at : 9:15 AM   Call this number if you have problems the morning of surgery [phone]   Eat a light diet the day before surgery.  Examples including soups, broths, toast, yogurt, mashed potatoes.  Things to avoid include carbonated beverages (fizzy beverages), raw fruits and raw vegetables, or beans.   If your bowels are filled with gas, your surgeon will have difficulty visualizing your pelvic organs which increases your surgical risks.  Do not eat food :After Midnight.   After Midnight you may have the following liquids until: 8:30 AM DAY OF SURGERY  Water Non-Citrus Juices (without pulp, NO RED-Apple, White grape, White cranberry) Black Coffee (NO MILK/CREAM OR CREAMERS, sugar ok)  Clear Tea (NO MILK/CREAM OR CREAMERS, sugar ok) regular and decaf                             Plain Jell-O (NO RED)                                           Fruit ices (not with fruit pulp, NO RED)                                     Popsicles (NO RED)                                                               Sports drinks like Gatorade (NO  RED)              FOLLOW ANY ADDITIONAL PRE OP INSTRUCTIONS YOU RECEIVED FROM YOUR SURGEON'S OFFICE!!!   Oral Hygiene is also important to reduce your risk of infection.                                    Remember - BRUSH YOUR TEETH THE MORNING OF SURGERY WITH YOUR REGULAR TOOTHPASTE  DENTURES WILL BE REMOVED PRIOR TO SURGERY PLEASE DO NOT APPLY "Poly grip" OR ADHESIVES!!!   Do NOT smoke after Midnight   Stop all vitamins and herbal supplements 7  days before surgery.   Take these medicines the morning of surgery with A SIP OF WATER: NONE. Use inhalers as usual and bring them.                              You may not have any metal on your body including hair pins, jewelry, and body piercing             Do not wear make-up, lotions, powders, perfumes/cologne, or deodorant  Do not wear nail polish including gel and S&S, artificial/acrylic nails, or any other type of covering on natural nails including finger and toenails. If you have artificial nails, gel coating, etc. that needs to be removed by a nail salon please have this removed prior to surgery or surgery may need to be canceled/ delayed if the surgeon/ anesthesia feels like they are unable to be safely monitored.   Do not shave  48 hours prior to surgery.    Do not bring valuables to the hospital. [facility] IS NOT             RESPONSIBLE   FOR VALUABLES.   Contacts, glasses, dentures or bridgework may not be worn into surgery.   Bring small overnight bag day of surgery.   DO NOT BRING YOUR HOME MEDICATIONS TO THE HOSPITAL. PHARMACY WILL DISPENSE MEDICATIONS LISTED ON YOUR MEDICATION LIST TO YOU DURING YOUR ADMISSION IN THE HOSPITAL!    Patients discharged on the day of surgery will not be allowed to drive home.  Someone NEEDS to stay with you for the first 24 hours after anesthesia.   Special Instructions: Bring a copy of your healthcare power of attorney and living will documents the day of surgery if you haven't scanned  them before.              Please read over the following fact sheets you were given: IF YOU HAVE QUESTIONS ABOUT YOUR PRE-OP INSTRUCTIONS PLEASE CALL [phone]   If you received a COVID test during your pre-op visit  it is requested that you wear a mask when out in public, stay away from anyone that may not be feeling well and notify your surgeon if you develop symptoms. If you test positive for Covid or have been in contact with anyone that has tested positive in the last 10 days please notify you surgeon.    [facility] - Preparing for Surgery Before surgery, you can play an important role.  Because skin is not sterile, your skin needs to be as free of germs as possible.  You can reduce the number of germs on your skin by washing with CHG (chlorahexidine gluconate) soap before surgery.  CHG is an antiseptic cleaner which kills germs and bonds with the skin to continue killing germs even after washing. Please DO NOT use if you have an allergy to CHG or antibacterial soaps.  If your skin becomes reddened/irritated stop using the CHG and inform your nurse when you arrive at Short Stay. Do not shave (including legs and underarms) for at least 48 hours prior to the first CHG shower.  You may shave your face/neck. Please follow these instructions carefully:  1.  Shower with CHG Soap the night before surgery and the  morning of Surgery.  2.  If you choose to wash your hair, wash your hair first as usual with your  normal  shampoo.  3.  After you shampoo, rinse your  hair and body thoroughly to remove the  shampoo.                           4.  Use CHG as you would any other liquid soap.  You can apply chg directly  to the skin and wash                       Gently with a scrungie or clean washcloth.  5.  Apply the CHG Soap to your body ONLY FROM THE NECK DOWN.   Do not use on face/ open                           Wound or open sores. Avoid contact with eyes, ears mouth and genitals (private parts).                        Wash face,  Genitals (private parts) with your normal soap.             6.  Wash thoroughly, paying special attention to the area where your surgery  will be performed.  7.  Thoroughly rinse your body with warm water from the neck down.  8.  DO NOT shower/wash with your normal soap after using and rinsing off  the CHG Soap.                9.  Pat yourself dry with a clean towel.            10.  Wear clean pajamas.            11.  Place clean sheets on your bed the night of your first shower and do not  sleep with pets. Day of Surgery : Do not apply any lotions/deodorants the morning of surgery.  Please wear clean clothes to the hospital/surgery center.  FAILURE TO FOLLOW THESE INSTRUCTIONS MAY RESULT IN THE CANCELLATION OF YOUR SURGERY PATIENT SIGNATURE_________________________________  NURSE SIGNATURE__________________________________  ________________________________________________________________________

## 2024-06-08 ENCOUNTER — Other Ambulatory Visit: Payer: Self-pay

## 2024-06-08 ENCOUNTER — Encounter (HOSPITAL_COMMUNITY): Payer: Self-pay

## 2024-06-08 ENCOUNTER — Encounter (HOSPITAL_COMMUNITY)
Admission: RE | Admit: 2024-06-08 | Discharge: 2024-06-08 | Disposition: A | Discharge status: Home / Self Care | Payer: MEDICAID | Source: Ambulatory Visit | Attending: Surgery | Admitting: Surgery

## 2024-06-08 VITALS — BP 111/68 | HR 62 | Temp 98.4°F | Ht 63.0 in | Wt 120.0 lb

## 2024-06-08 DIAGNOSIS — Z01818 Encounter for other preprocedural examination: Secondary | ICD-10-CM

## 2024-06-08 DIAGNOSIS — Z01812 Encounter for preprocedural laboratory examination: Secondary | ICD-10-CM | POA: Insufficient documentation

## 2024-06-08 LAB — CBC
HCT: 36.1 % (ref 36.0–46.0)
Hemoglobin: 12 g/dL (ref 12.0–15.0)
MCH: 30.3 pg (ref 26.0–34.0)
MCHC: 33.2 g/dL (ref 30.0–36.0)
MCV: 91.2 fL (ref 80.0–100.0)
Platelets: 342 10*3/uL (ref 150–400)
RBC: 3.96 MIL/uL (ref 3.87–5.11)
RDW: 12.1 % (ref 11.5–15.5)
WBC: 6 10*3/uL (ref 4.0–10.5)
nRBC: 0 % (ref 0.0–0.2)

## 2024-06-08 NOTE — Progress Notes (Addendum)
 For Anesthesia: PCP - Practice, [facility]  Cardiologist - N/A  Bowel Prep reminder:N/A  Chest x-ray -  EKG -  Stress Test -  ECHO -  Cardiac Cath -  Pacemaker/ICD device last checked: Pacemaker orders received: Device Rep notified:  Spinal Cord Stimulator:N/A  Sleep Study - N/A CPAP -   Fasting Blood Sugar - N/A Checks Blood Sugar _____ times a day Date and result of last Hgb A1c-  Last dose of GLP1 agonist- N/A GLP1 instructions:   Last dose of SGLT-2 inhibitors- N/A SGLT-2 instructions:   Blood Thinner Instructions:N/A Aspirin Instructions: Last Dose:  Activity level: Can go up a flight of stairs and activities of daily living without stopping and without chest pain and/or shortness of breath   Able to exercise without chest pain and/or shortness of breath  Anesthesia review:   Patient denies shortness of breath, fever, cough and chest pain at PAT appointment   Patient verbalized understanding of instructions that were given to them at the PAT appointment. Patient was also instructed that they will need to review over the PAT instructions again at home before surgery.

## 2024-06-10 ENCOUNTER — Ambulatory Visit (HOSPITAL_COMMUNITY): Payer: MEDICAID | Admitting: Certified Registered"

## 2024-06-10 ENCOUNTER — Ambulatory Visit (HOSPITAL_BASED_OUTPATIENT_CLINIC_OR_DEPARTMENT_OTHER): Payer: MEDICAID | Admitting: Certified Registered"

## 2024-06-10 ENCOUNTER — Encounter (HOSPITAL_COMMUNITY): Payer: Self-pay | Admitting: Surgery

## 2024-06-10 ENCOUNTER — Ambulatory Visit (HOSPITAL_COMMUNITY)
Admission: RE | Admit: 2024-06-10 | Discharge: 2024-06-10 | Disposition: A | Discharge status: Home / Self Care | Payer: MEDICAID | Attending: Surgery | Admitting: Surgery

## 2024-06-10 ENCOUNTER — Encounter (HOSPITAL_COMMUNITY)
Admission: RE | Disposition: A | Discharge status: Home / Self Care | Payer: Self-pay | Source: Home / Self Care | Attending: Surgery

## 2024-06-10 ENCOUNTER — Other Ambulatory Visit: Payer: Self-pay

## 2024-06-10 DIAGNOSIS — K828 Other specified diseases of gallbladder: Secondary | ICD-10-CM | POA: Diagnosis present

## 2024-06-10 DIAGNOSIS — G43D0 Abdominal migraine, not intractable: Secondary | ICD-10-CM | POA: Insufficient documentation

## 2024-06-10 DIAGNOSIS — Z01818 Encounter for other preprocedural examination: Secondary | ICD-10-CM

## 2024-06-10 DIAGNOSIS — R55 Syncope and collapse: Secondary | ICD-10-CM | POA: Insufficient documentation

## 2024-06-10 HISTORY — PX: CHOLECYSTECTOMY: SHX55

## 2024-06-10 LAB — POCT PREGNANCY, URINE: Preg Test, Ur: NEGATIVE

## 2024-06-10 SURGERY — LAPAROSCOPIC CHOLECYSTECTOMY
Anesthesia: General

## 2024-06-10 MED ORDER — DEXAMETHASONE SODIUM PHOSPHATE 10 MG/ML IJ SOLN
INTRAMUSCULAR | Status: DC | PRN
Start: 2024-06-10 — End: 2024-06-10
  Administered 2024-06-10: 6 mg via INTRAVENOUS

## 2024-06-10 MED ORDER — INDOCYANINE GREEN 25 MG IV SOLR
1.2500 mg | Freq: Once | INTRAVENOUS | Status: AC
  Administered 2024-06-10: 1.25 mg via INTRAVENOUS
  Filled 2024-06-10: qty 10

## 2024-06-10 MED ORDER — LACTATED RINGERS IR SOLN
Status: DC | PRN
Start: 2024-06-10 — End: 2024-06-10
  Administered 2024-06-10: 1000 mL

## 2024-06-10 MED ORDER — LIDOCAINE HCL (PF) 2 % IJ SOLN
INTRAMUSCULAR | Status: AC
  Filled 2024-06-10: qty 10

## 2024-06-10 MED ORDER — DEXAMETHASONE SODIUM PHOSPHATE 10 MG/ML IJ SOLN
INTRAMUSCULAR | Status: AC
  Filled 2024-06-10: qty 1

## 2024-06-10 MED ORDER — BUPIVACAINE-EPINEPHRINE (PF) 0.25% -1:200000 IJ SOLN
INTRAMUSCULAR | Status: AC
  Filled 2024-06-10: qty 30

## 2024-06-10 MED ORDER — BUPIVACAINE LIPOSOME 1.3 % IJ SUSP
INTRAMUSCULAR | Status: AC
  Filled 2024-06-10: qty 20

## 2024-06-10 MED ORDER — PHENYLEPHRINE 80 MCG/ML (10ML) SYRINGE FOR IV PUSH (FOR BLOOD PRESSURE SUPPORT)
PREFILLED_SYRINGE | INTRAVENOUS | Status: DC | PRN
  Administered 2024-06-10 (×2): 40 ug via INTRAVENOUS

## 2024-06-10 MED ORDER — DEXMEDETOMIDINE HCL IN NACL 80 MCG/20ML IV SOLN
INTRAVENOUS | Status: AC
  Filled 2024-06-10: qty 20

## 2024-06-10 MED ORDER — LACTATED RINGERS IV SOLN: INTRAVENOUS | Status: DC

## 2024-06-10 MED ORDER — ROCURONIUM BROMIDE 10 MG/ML (PF) SYRINGE
PREFILLED_SYRINGE | INTRAVENOUS | Status: AC
  Filled 2024-06-10: qty 10

## 2024-06-10 MED ORDER — ONDANSETRON HCL 4 MG/2ML IJ SOLN
INTRAMUSCULAR | Status: DC | PRN
  Administered 2024-06-10: 4 mg via INTRAVENOUS

## 2024-06-10 MED ORDER — CHLORHEXIDINE GLUCONATE 4 % EX SOLN: 60.0000 mL | Freq: Once | CUTANEOUS | Status: DC

## 2024-06-10 MED ORDER — DROPERIDOL 2.5 MG/ML IJ SOLN: 0.6250 mg | Freq: Once | INTRAMUSCULAR | Status: DC | PRN

## 2024-06-10 MED ORDER — DOCUSATE SODIUM 100 MG PO CAPS: 100.0000 mg | ORAL_CAPSULE | Freq: Two times a day (BID) | ORAL | 0 refills | Status: AC

## 2024-06-10 MED ORDER — FENTANYL CITRATE PF 50 MCG/ML IJ SOSY
25.0000 ug | PREFILLED_SYRINGE | INTRAMUSCULAR | Status: DC | PRN
  Administered 2024-06-10 (×4): 25 ug via INTRAVENOUS

## 2024-06-10 MED ORDER — SUGAMMADEX SODIUM 200 MG/2ML IV SOLN
INTRAVENOUS | Status: DC | PRN
Start: 2024-06-10 — End: 2024-06-10
  Administered 2024-06-10: 120 mg via INTRAVENOUS

## 2024-06-10 MED ORDER — BUPIVACAINE LIPOSOME 1.3 % IJ SUSP
INTRAMUSCULAR | Status: DC | PRN
  Administered 2024-06-10: 20 mL

## 2024-06-10 MED ORDER — OXYCODONE HCL 5 MG/5ML PO SOLN: 5.0000 mg | Freq: Once | ORAL | Status: DC | PRN

## 2024-06-10 MED ORDER — MIDAZOLAM HCL 2 MG/2ML IJ SOLN
INTRAMUSCULAR | Status: AC
  Filled 2024-06-10: qty 2

## 2024-06-10 MED ORDER — TRAMADOL HCL 50 MG PO TABS: 50.0000 mg | ORAL_TABLET | Freq: Four times a day (QID) | ORAL | 0 refills | Status: AC | PRN

## 2024-06-10 MED ORDER — BUPIVACAINE-EPINEPHRINE 0.25% -1:200000 IJ SOLN
INTRAMUSCULAR | Status: DC | PRN
  Administered 2024-06-10: 30 mL

## 2024-06-10 MED ORDER — ORAL CARE MOUTH RINSE: 15.0000 mL | Freq: Once | OROMUCOSAL | Status: AC

## 2024-06-10 MED ORDER — CEFAZOLIN SODIUM-DEXTROSE 2-4 GM/100ML-% IV SOLN
2.0000 g | INTRAVENOUS | Status: AC
  Administered 2024-06-10: 2 g via INTRAVENOUS
  Filled 2024-06-10: qty 100

## 2024-06-10 MED ORDER — FENTANYL CITRATE PF 50 MCG/ML IJ SOSY
PREFILLED_SYRINGE | INTRAMUSCULAR | Status: AC
  Filled 2024-06-10: qty 2

## 2024-06-10 MED ORDER — FENTANYL CITRATE (PF) 100 MCG/2ML IJ SOLN
INTRAMUSCULAR | Status: AC
  Filled 2024-06-10: qty 2

## 2024-06-10 MED ORDER — EPHEDRINE 5 MG/ML INJ
INTRAVENOUS | Status: AC
  Filled 2024-06-10: qty 5

## 2024-06-10 MED ORDER — ACETAMINOPHEN 500 MG PO TABS
1000.0000 mg | ORAL_TABLET | ORAL | Status: AC
  Administered 2024-06-10: 1000 mg via ORAL
  Filled 2024-06-10: qty 2

## 2024-06-10 MED ORDER — PHENYLEPHRINE 80 MCG/ML (10ML) SYRINGE FOR IV PUSH (FOR BLOOD PRESSURE SUPPORT)
PREFILLED_SYRINGE | INTRAVENOUS | Status: AC
  Filled 2024-06-10: qty 10

## 2024-06-10 MED ORDER — ROCURONIUM BROMIDE 10 MG/ML (PF) SYRINGE
PREFILLED_SYRINGE | INTRAVENOUS | Status: DC | PRN
  Administered 2024-06-10: 40 mg via INTRAVENOUS

## 2024-06-10 MED ORDER — BUPIVACAINE LIPOSOME 1.3 % IJ SUSP: 20.0000 mL | Freq: Once | INTRAMUSCULAR | Status: DC

## 2024-06-10 MED ORDER — CHLORHEXIDINE GLUCONATE 0.12 % MT SOLN
15.0000 mL | Freq: Once | OROMUCOSAL | Status: AC
  Administered 2024-06-10: 15 mL via OROMUCOSAL

## 2024-06-10 MED ORDER — FENTANYL CITRATE (PF) 100 MCG/2ML IJ SOLN
INTRAMUSCULAR | Status: DC | PRN
  Administered 2024-06-10 (×2): 50 ug via INTRAVENOUS
  Administered 2024-06-10: 25 ug via INTRAVENOUS
  Administered 2024-06-10: 50 ug via INTRAVENOUS
  Administered 2024-06-10: 25 ug via INTRAVENOUS

## 2024-06-10 MED ORDER — LIDOCAINE HCL (CARDIAC) PF 100 MG/5ML IV SOSY
PREFILLED_SYRINGE | INTRAVENOUS | Status: DC | PRN
  Administered 2024-06-10: 80 mg via INTRAVENOUS

## 2024-06-10 MED ORDER — 0.9 % SODIUM CHLORIDE (POUR BTL) OPTIME
TOPICAL | Status: DC | PRN
  Administered 2024-06-10: 1000 mL

## 2024-06-10 MED ORDER — MIDAZOLAM HCL 2 MG/2ML IJ SOLN
INTRAMUSCULAR | Status: DC | PRN
  Administered 2024-06-10: 2 mg via INTRAVENOUS

## 2024-06-10 MED ORDER — ALBUTEROL SULFATE HFA 108 (90 BASE) MCG/ACT IN AERS
INHALATION_SPRAY | RESPIRATORY_TRACT | Status: AC
  Filled 2024-06-10: qty 6.7

## 2024-06-10 MED ORDER — GABAPENTIN 300 MG PO CAPS
300.0000 mg | ORAL_CAPSULE | ORAL | Status: AC
  Administered 2024-06-10: 300 mg via ORAL
  Filled 2024-06-10: qty 1

## 2024-06-10 MED ORDER — ONDANSETRON HCL 4 MG/2ML IJ SOLN
INTRAMUSCULAR | Status: AC
  Filled 2024-06-10: qty 2

## 2024-06-10 MED ORDER — ACETAMINOPHEN 10 MG/ML IV SOLN: 1000.0000 mg | Freq: Once | INTRAVENOUS | Status: DC | PRN

## 2024-06-10 MED ORDER — OXYCODONE HCL 5 MG PO TABS: 5.0000 mg | ORAL_TABLET | Freq: Once | ORAL | Status: DC | PRN

## 2024-06-10 MED ORDER — PROPOFOL 10 MG/ML IV BOLUS
INTRAVENOUS | Status: DC | PRN
  Administered 2024-06-10: 120 mg via INTRAVENOUS

## 2024-06-10 SURGICAL SUPPLY — 35 items
BAG COUNTER SPONGE SURGICOUNT (BAG) ×1 IMPLANT
BENZOIN TINCTURE PRP APPL 2/3 (GAUZE/BANDAGES/DRESSINGS) IMPLANT
BNDG ADH 1X3 SHEER STRL LF (GAUZE/BANDAGES/DRESSINGS) IMPLANT
CABLE HIGH FREQUENCY MONO STRZ (ELECTRODE) ×1 IMPLANT
CHLORAPREP W/TINT 26 (MISCELLANEOUS) ×1 IMPLANT
CLIP APPLIE ROT 10 11.4 M/L (STAPLE) ×1 IMPLANT
COVER MAYO STAND XLG (MISCELLANEOUS) IMPLANT
COVER SURGICAL LIGHT HANDLE (MISCELLANEOUS) ×1 IMPLANT
DERMABOND ADVANCED .7 DNX12 (GAUZE/BANDAGES/DRESSINGS) IMPLANT
DRAPE C-ARM 42X120 X-RAY (DRAPES) IMPLANT
ELECT REM PT RETURN 15FT ADLT (MISCELLANEOUS) ×1 IMPLANT
ENDOLOOP SUT PDS II 0 18 (SUTURE) IMPLANT
GLOVE BIO SURGEON STRL SZ 6 (GLOVE) ×1 IMPLANT
GLOVE INDICATOR 6.5 STRL GRN (GLOVE) ×1 IMPLANT
GOWN STRL REUS W/ TWL LRG LVL3 (GOWN DISPOSABLE) ×1 IMPLANT
GRASPER SUT TROCAR 14GX15 (MISCELLANEOUS) ×1 IMPLANT
HEMOSTAT SNOW SURGICEL 2X4 (HEMOSTASIS) IMPLANT
IRRIGATION SUCT STRKRFLW 2 WTP (MISCELLANEOUS) ×1 IMPLANT
KIT BASIN OR (CUSTOM PROCEDURE TRAY) ×1 IMPLANT
KIT TURNOVER KIT A (KITS) ×1 IMPLANT
NDL INSUFFLATION 14GA 120MM (NEEDLE) ×1 IMPLANT
NEEDLE INSUFFLATION 14GA 120MM (NEEDLE) ×1 IMPLANT
SCISSORS LAP 5X35 DISP (ENDOMECHANICALS) ×1 IMPLANT
SET CHOLANGIOGRAPH MIX (MISCELLANEOUS) IMPLANT
SET TUBE SMOKE EVAC HIGH FLOW (TUBING) ×1 IMPLANT
SLEEVE Z-THREAD 5X100MM (TROCAR) ×1 IMPLANT
SPIKE FLUID TRANSFER (MISCELLANEOUS) ×1 IMPLANT
STRIP CLOSURE SKIN 1/2X4 (GAUZE/BANDAGES/DRESSINGS) IMPLANT
SUT MNCRL AB 4-0 PS2 18 (SUTURE) ×1 IMPLANT
SYSTEM BAG RETRIEVAL 10MM (BASKET) IMPLANT
TOWEL OR 17X26 10 PK STRL BLUE (TOWEL DISPOSABLE) ×1 IMPLANT
TRAY LAPAROSCOPIC (CUSTOM PROCEDURE TRAY) ×1 IMPLANT
TROCAR ADV FIXATION 12X100MM (TROCAR) ×1 IMPLANT
TROCAR XCEL NON-BLD 5MMX100MML (ENDOMECHANICALS) IMPLANT
TROCAR Z-THREAD OPTICAL 5X100M (TROCAR) ×1 IMPLANT

## 2024-06-10 NOTE — Op Note (Addendum)
 Operative Note  Brenda Benton 18 y.o. female [id number]  06/10/2024  Surgeon: Adalberto Acton MD FACS I was personally present during the key and critical portions of this procedure and immediately available throughout the entire procedure, as documented in my operative note.  Assistant: Ardyth Kroner MD ([id number])  Procedure performed: Laparoscopic Cholecystectomy with near infrared fluorescent cholangiography  Preop diagnosis: biliary dysknesia Post-op diagnosis/intraop findings: same  Specimens: gallbladder  Retained items: none  EBL: minimal  Complications: none  Description of procedure: After confirming informed consent the patient was brought to the operating room. Antibiotics were administered. SCD's were applied. General endotracheal anesthesia was initiated and a formal time-out was performed. The abdomen was prepped and draped in the usual sterile fashion and the abdomen was entered using an infraumbilical veress needle after instilling the site with local. Insufflation to was obtained, 5mm trocar and camera inserted, and gross inspection revealed no evidence of injury from our entry or other intraabdominal abnormalities. Two 5mm trocars were introduced in the right midclavicular and right anterior axillary lines under direct visualization and following infiltration with local. A 12mm trocar was placed in the epigastrium.  The gallbladder fundus was retracted cephalad and the infundibulum was retracted laterally. A combination of hook electrocautery and blunt dissection was utilized to clear the peritoneum from the neck and cystic duct, circumferentially isolating the cystic artery and cystic duct and lifting the gallbladder from the cystic plate. The critical view of safety was achieved with the cystic artery, cystic duct, and liver bed visualized between them with no other structures. Near infrared fluorescent cholangiography was then activated which demonstrated concordant  anatomy.  This also illuminated the common bile duct which was well away from the area of dissection.  The cystic artery was clipped with a single clip proximally and distally and divided as was the cystic duct with 4 clips on the proximal end. The gallbladder was dissected from the liver plate using electrocautery.  The gallbladder wall was disrupted along the mid body spilling bile but no stones during this part of the dissection.  There was 1 area of thickened peritoneum that we thought looked concerning for a duct of Luschka and this was clipped, this was fairly high up along the gallbladder bed near the fundus.  Near infrared fluorescent cholangiography was attempted but due to the bile spillage it was difficult to determine if there was any true biliary involvement of this.  Once freed the gallbladder was placed in an endocatch bag and removed through the epigastric trocar site.  The right upper quadrant was irrigated with warm sterile saline; the effluent was clear. Hemostasis was once again confirmed, and reinspection of the abdomen revealed no injuries. The clips were well apposed without any bile leak from the duct or the liver bed either on direct inspection or with near infrared fluorescent cholangiography. The 12mm trocar site in the epigastrium was closed with a 0 vicryl in the fascia under direct visualization using a PMI device. The abdomen was desufflated and all trocars removed. The skin incisions were closed with subcuticular 4-0 monocryl and Dermabond. The patient was awakened, extubated and transported to the recovery room in stable condition.    All counts were correct at the completion of the case.

## 2024-06-10 NOTE — Discharge Instructions (Signed)
 LAPAROSCOPIC SURGERY: POST OP INSTRUCTIONS   EAT Gradually transition to your usual diet after discharge.  WALK Walk an hour a day (cumulative- not all at once).  Control your pain to do that.    CONTROL PAIN Control pain so that you can walk, sleep, tolerate sneezing/coughing, go up/down stairs.  HAVE A BOWEL MOVEMENT DAILY Keep your bowels regular to avoid problems.  OK to try a laxative to override constipation.  OK to use an antidiarrheal to slow down diarrhea.  Call if not better after 2 tries  CALL IF YOU HAVE PROBLEMS/CONCERNS Call if you are still struggling despite following these instructions. Call if you have concerns not answered by these instructions    DIET: Follow a light bland diet & liquids the first 24 hours after arrival home, such as soup, liquids, starches, etc.  Be sure to drink plenty of fluids.  Quickly advance to a usual solid diet within a few days.  Avoid fast food or heavy meals initially as you are more likely to get nauseated or have irregular bowels.   Take your usually prescribed home medications unless otherwise directed.  PAIN CONTROL: Pain is best controlled by a usual combination of three different methods TOGETHER: Ice/Heat Over the counter pain medication Prescription pain medication Most patients will experience some swelling and bruising around the incisions.  Ice packs or heating pads (30-60 minutes up to 6 times a day) will help. Use ice for the first few days to help decrease swelling and bruising, then switch to heat to help relax tight/sore spots and speed recovery.  Some people prefer to use ice alone, heat alone, alternating between ice & heat.  Experiment to what works for you.  Swelling and bruising can take several weeks to resolve.   It is helpful to take an over-the-counter pain medication regularly for the first few days: Naproxen (Aleve, etc)  Two 220mg  tabs twice a day OR Ibuprofen (Advil, etc) Three 200mg  tabs four times a day  (every meal & bedtime) AND Acetaminophen  (Tylenol , etc) 500-650mg  four times a day (every meal & bedtime) A  prescription for pain medication (such as oxycodone, hydrocodone, tramadol, gabapentin, methocarbamol, etc) should be given to you upon discharge.  Take your pain medication as prescribed, IF NEEDED.  If you are having problems/concerns with the prescription medicine (does not control pain, nausea, vomiting, rash, itching, etc), please call us  [phone]) [phone] to see if we need to switch you to a different pain medicine that will work better for you and/or control your side effect better. If you need a refill on your pain medication, please give us  48 hour notice.  contact your pharmacy.  They will contact our office to request authorization. Prescriptions will not be filled after 5 pm or on week-ends  Avoid getting constipated.   Between the surgery and the pain medications, it is common to experience some constipation.   Increasing fluid intake and taking a fiber supplement (such as Metamucil, Citrucel, FiberCon, MiraLax , etc) 1-2 times a day regularly will usually help prevent this problem from occurring.   A mild laxative (prune juice, Milk of Magnesia, MiraLax , etc) should be taken according to package directions if there are no bowel movements after 48 hours.   Watch out for diarrhea.   If you have many loose bowel movements, simplify your diet to bland foods & liquids for a few days.   Stop any stool softeners and decrease your fiber supplement.   Switching to mild anti-diarrheal medications (  Kayopectate, Pepto Bismol) can help.   If this worsens or does not improve, please call us .  Wash / shower every day.  You may shower over the skin glue which is waterproof.  Do not soak or submerge incisions.  No rubbing, scrubbing, lotions or ointments to incisions.  Glue will flake off after about 2 weeks.  You may leave the incision open to air.  You may replace a dressing/Band-Aid to cover the  incision for comfort if you wish.   ACTIVITIES as tolerated:   You may resume regular (light) daily activities beginning the next day--such as daily self-care, walking, climbing stairs--gradually increasing activities as tolerated.  If you can walk 30 minutes without difficulty, it is safe to try more intense activity such as jogging, treadmill, bicycling, low-impact aerobics, swimming, etc. Save the most intensive and strenuous activity for last such as sit-ups, heavy lifting, contact sports, etc  Refrain from any heavy lifting or straining until you are off narcotics for pain control.   DO NOT PUSH THROUGH PAIN.  Let pain be your guide: If it hurts to do something, don't do it.  Pain is your body warning you to avoid that activity for another week until the pain goes down. You may drive when you are no longer taking prescription pain medication, you can comfortably wear a seatbelt, and you can safely maneuver your car and apply brakes. You may have sexual intercourse when it is comfortable.  FOLLOW UP in our office Please call [facility] at [phone] to set up an appointment to see your surgeon in the office for a follow-up appointment approximately 2-3 weeks after your surgery. Make sure that you call for this appointment the day you arrive home to insure a convenient appointment time.  10. IF YOU HAVE DISABILITY OR FAMILY LEAVE FORMS, BRING THEM TO THE OFFICE FOR PROCESSING.  DO NOT GIVE THEM TO YOUR DOCTOR.   WHEN TO CALL US  ([phone]) [phone]: Poor pain control Reactions / problems with new medications (rash/itching, nausea, etc)  Fever over 101.5 F (38.5 C) Inability to urinate Nausea and/or vomiting Worsening swelling or bruising Continued bleeding from incision. Increased pain, redness, or drainage from the incision   The clinic staff is available to answer your questions during regular business hours (8:30am-5pm).  Please don't hesitate to call and ask to speak to one of our nurses for  clinical concerns.   If you have a medical emergency, go to the nearest emergency room or call 911.  A surgeon from [facility] is always on call at the [facility], [place] [address], [place], [place]  [address] ? MAIN: [phone] ? TOLL FREE: [phone] ?  FAX [phone] [email or web address]

## 2024-06-10 NOTE — Transfer of Care (Signed)
 Immediate Anesthesia Transfer of Care Note  Patient: Brenda Benton  Procedure(s) Performed: LAPAROSCOPIC CHOLECYSTECTOMY  Patient Location: PACU  Anesthesia Type:General  Level of Consciousness: awake, drowsy, and patient cooperative  Airway & Oxygen Therapy: Patient Spontanous Breathing and Patient connected to face mask oxygen  Post-op Assessment: Report given to RN and Post -op Vital signs reviewed and stable  Post vital signs: Reviewed and stable  Last Vitals:  Vitals Value Taken Time  BP 108/67 06/10/24 14:26  Temp    Pulse 86 06/10/24 14:27  Resp 19 06/10/24 14:27  SpO2 100 % 06/10/24 14:27  Vitals shown include unfiled device data.  Last Pain:  Vitals:   06/10/24 0944  TempSrc: Oral  PainSc:          Complications: No notable events documented.

## 2024-06-10 NOTE — Anesthesia Procedure Notes (Signed)
 Procedure Name: Intubation Date/Time: 06/10/2024 12:46 PM  Performed by: Alwyn Juba, CRNAPre-anesthesia Checklist: Patient identified, Emergency Drugs available, Suction available, Patient being monitored and Timeout performed Patient Re-evaluated:Patient Re-evaluated prior to induction Oxygen Delivery Method: Circle system utilized Preoxygenation: Pre-oxygenation with 100% oxygen Induction Type: IV induction Ventilation: Mask ventilation without difficulty Laryngoscope Size: Mac and 3 Grade View: Grade I Tube type: Oral Tube size: 7.0 mm Number of attempts: 1 Airway Equipment and Method: Stylet Placement Confirmation: ETT inserted through vocal cords under direct vision, positive ETCO2 and breath sounds checked- equal and bilateral Secured at: 21 cm Tube secured with: Tape Dental Injury: Teeth and Oropharynx as per pre-operative assessment

## 2024-06-10 NOTE — Anesthesia Preprocedure Evaluation (Addendum)
 Anesthesia Evaluation  Patient identified by MRN, date of birth, ID band Patient awake    Reviewed: Allergy & Precautions, H&P , NPO status , Patient's Chart, lab work & pertinent test results  Airway Mallampati: II  TM Distance: >3 FB Neck ROM: Full    Dental no notable dental hx.    Pulmonary asthma    Pulmonary exam normal breath sounds clear to auscultation       Cardiovascular negative cardio ROS Normal cardiovascular exam Rhythm:Regular Rate:Normal     Neuro/Psych  Headaches PSYCHIATRIC DISORDERS Anxiety Depression       GI/Hepatic Neg liver ROS,,,Biliary dyskinesia   Endo/Other  negative endocrine ROS    Renal/GU negative Renal ROS  negative genitourinary   Musculoskeletal negative musculoskeletal ROS (+)    Abdominal   Peds negative pediatric ROS (+)  Hematology negative hematology ROS (+)   Anesthesia Other Findings   Reproductive/Obstetrics negative OB ROS                             Anesthesia Physical Anesthesia Plan  ASA: 2  Anesthesia Plan: General   Post-op Pain Management: Tylenol  PO (pre-op)*   Induction: Intravenous  PONV Risk Score and Plan: 4 or greater and Ondansetron , Dexamethasone, Midazolam and Treatment may vary due to age or medical condition  Airway Management Planned: Oral ETT  Additional Equipment: None  Intra-op Plan:   Post-operative Plan: Extubation in OR  Informed Consent: I have reviewed the patients History and Physical, chart, labs and discussed the procedure including the risks, benefits and alternatives for the proposed anesthesia with the patient or authorized representative who has indicated his/her understanding and acceptance.     Dental advisory given  Plan Discussed with: CRNA  Anesthesia Plan Comments:         Anesthesia Quick Evaluation

## 2024-06-10 NOTE — Anesthesia Postprocedure Evaluation (Signed)
 Anesthesia Post Note  Patient: Rhiannan Ochs  Procedure(s) Performed: LAPAROSCOPIC CHOLECYSTECTOMY     Patient location during evaluation: PACU Anesthesia Type: General Level of consciousness: awake and alert Pain management: pain level controlled Vital Signs Assessment: post-procedure vital signs reviewed and stable Respiratory status: spontaneous breathing, nonlabored ventilation, respiratory function stable and patient connected to nasal cannula oxygen Cardiovascular status: blood pressure returned to baseline and stable Postop Assessment: no apparent nausea or vomiting Anesthetic complications: no   No notable events documented.  Last Vitals:  Vitals:   06/10/24 1530 06/10/24 1545  BP:  114/73  Pulse: 80 77  Resp: 14 15  Temp:  36.8 C  SpO2: 100% 100%    Last Pain:  Vitals:   06/10/24 1545  TempSrc:   PainSc: 2                  Langdon Crosson

## 2024-06-10 NOTE — Interval H&P Note (Signed)
 History and Physical Interval Note:  06/10/2024 12:03 PM  Brenda Benton  has presented today for surgery, with the diagnosis of BILIARY COLIC.  The various methods of treatment have been discussed with the patient and family. After consideration of risks, benefits and other options for treatment, the patient has consented to  Procedure(s) with comments: LAPAROSCOPIC CHOLECYSTECTOMY (N/A) - LAP CHOLE WITH ICG INDOCYANINE as a surgical intervention.  The patient's history has been reviewed, patient examined, no change in status, stable for surgery.  I have reviewed the patient's chart and labs.  Questions were answered to the patient's satisfaction.     Sharian Delia Loyola Rummage

## 2024-06-11 ENCOUNTER — Encounter (HOSPITAL_COMMUNITY): Payer: Self-pay | Admitting: Surgery

## 2024-06-13 LAB — SURGICAL PATHOLOGY

## 2024-06-29 ENCOUNTER — Ambulatory Visit
Admission: RE | Admit: 2024-06-29 | Discharge: 2024-06-29 | Disposition: A | Discharge status: Home / Self Care | Payer: MEDICAID | Source: Ambulatory Visit | Attending: Surgery | Admitting: Surgery

## 2024-06-29 ENCOUNTER — Other Ambulatory Visit: Payer: Self-pay | Admitting: Surgery

## 2024-06-29 DIAGNOSIS — Z09 Encounter for follow-up examination after completed treatment for conditions other than malignant neoplasm: Secondary | ICD-10-CM

## 2024-09-22 ENCOUNTER — Emergency Department (HOSPITAL_COMMUNITY)
Admission: EM | Admit: 2024-09-22 | Discharge: 2024-09-22 | Disposition: A | Discharge status: Home / Self Care | Payer: MEDICAID | Attending: Emergency Medicine | Admitting: Emergency Medicine

## 2024-09-22 ENCOUNTER — Other Ambulatory Visit: Payer: Self-pay

## 2024-09-22 ENCOUNTER — Encounter (HOSPITAL_COMMUNITY): Payer: Self-pay | Admitting: Emergency Medicine

## 2024-09-22 ENCOUNTER — Emergency Department (HOSPITAL_COMMUNITY): Payer: MEDICAID

## 2024-09-22 DIAGNOSIS — R1031 Right lower quadrant pain: Secondary | ICD-10-CM | POA: Diagnosis not present

## 2024-09-22 DIAGNOSIS — E876 Hypokalemia: Secondary | ICD-10-CM | POA: Insufficient documentation

## 2024-09-22 DIAGNOSIS — R109 Unspecified abdominal pain: Secondary | ICD-10-CM | POA: Diagnosis present

## 2024-09-22 DIAGNOSIS — R1011 Right upper quadrant pain: Secondary | ICD-10-CM | POA: Diagnosis not present

## 2024-09-22 DIAGNOSIS — D72829 Elevated white blood cell count, unspecified: Secondary | ICD-10-CM | POA: Insufficient documentation

## 2024-09-22 DIAGNOSIS — R1013 Epigastric pain: Secondary | ICD-10-CM | POA: Insufficient documentation

## 2024-09-22 DIAGNOSIS — R1010 Upper abdominal pain, unspecified: Secondary | ICD-10-CM

## 2024-09-22 LAB — COMPREHENSIVE METABOLIC PANEL WITH GFR
ALT: 15 U/L (ref 0–44)
AST: 22 U/L (ref 15–41)
Albumin: 3.9 g/dL (ref 3.5–5.0)
Alkaline Phosphatase: 47 U/L (ref 38–126)
Anion gap: 10 (ref 5–15)
BUN: 9 mg/dL (ref 6–20)
CO2: 23 mmol/L (ref 22–32)
Calcium: 8.7 mg/dL — ABNORMAL LOW (ref 8.9–10.3)
Chloride: 107 mmol/L (ref 98–111)
Creatinine, Ser: 0.59 mg/dL (ref 0.44–1.00)
GFR, Estimated: >60 mL/min (ref >60)
Glucose, Bld: 126 mg/dL — ABNORMAL HIGH (ref 70–99)
Potassium: 3.1 mmol/L — ABNORMAL LOW (ref 3.5–5.1)
Sodium: 140 mmol/L (ref 135–145)
Total Bilirubin: 0.6 mg/dL (ref 0.0–1.2)
Total Protein: 6.9 g/dL (ref 6.5–8.1)

## 2024-09-22 LAB — LIPASE, BLOOD: Lipase: 39 U/L (ref 11–51)

## 2024-09-22 LAB — URINALYSIS, ROUTINE W REFLEX MICROSCOPIC
Bacteria, UA: NONE SEEN
Bilirubin Urine: NEGATIVE
Glucose, UA: NEGATIVE mg/dL
Ketones, ur: NEGATIVE mg/dL
Leukocytes,Ua: NEGATIVE
Nitrite: NEGATIVE
Protein, ur: NEGATIVE mg/dL
Specific Gravity, Urine: 1.003 — ABNORMAL LOW (ref 1.005–1.030)
pH: 7 (ref 5.0–8.0)

## 2024-09-22 LAB — CBC
HCT: 36.1 % (ref 36.0–46.0)
Hemoglobin: 12.3 g/dL (ref 12.0–15.0)
MCH: 30.8 pg (ref 26.0–34.0)
MCHC: 34.1 g/dL (ref 30.0–36.0)
MCV: 90.5 fL (ref 80.0–100.0)
Platelets: 412 K/uL — ABNORMAL HIGH (ref 150–400)
RBC: 3.99 MIL/uL (ref 3.87–5.11)
RDW: 12.1 % (ref 11.5–15.5)
WBC: 9.5 K/uL (ref 4.0–10.5)
nRBC: 0 % (ref 0.0–0.2)

## 2024-09-22 LAB — HCG, SERUM, QUALITATIVE: Preg, Serum: NEGATIVE

## 2024-09-22 MED ORDER — DICYCLOMINE HCL 20 MG PO TABS: 20.0000 mg | ORAL_TABLET | Freq: Two times a day (BID) | ORAL | 0 refills | Status: AC

## 2024-09-22 MED ORDER — HYOSCYAMINE SULFATE 0.125 MG PO TABS
0.1250 mg | ORAL_TABLET | Freq: Once | ORAL | Status: AC
Start: 2024-09-22 — End: 2024-09-22
  Administered 2024-09-22: 0.125 mg via ORAL
  Filled 2024-09-22: qty 1

## 2024-09-22 MED ORDER — ONDANSETRON HCL 4 MG/2ML IJ SOLN
4.0000 mg | Freq: Once | INTRAMUSCULAR | Status: AC
  Administered 2024-09-22: 4 mg via INTRAVENOUS
  Filled 2024-09-22: qty 2

## 2024-09-22 MED ORDER — POTASSIUM CHLORIDE 20 MEQ PO PACK
40.0000 meq | PACK | Freq: Once | ORAL | Status: AC
  Administered 2024-09-22: 40 meq via ORAL
  Filled 2024-09-22: qty 2

## 2024-09-22 MED ORDER — IOHEXOL 300 MG/ML  SOLN
100.0000 mL | Freq: Once | INTRAMUSCULAR | Status: AC | PRN
  Administered 2024-09-22: 100 mL via INTRAVENOUS

## 2024-09-22 MED ORDER — MORPHINE SULFATE (PF) 4 MG/ML IV SOLN
4.0000 mg | Freq: Once | INTRAVENOUS | Status: AC
  Administered 2024-09-22: 4 mg via INTRAVENOUS
  Filled 2024-09-22: qty 1

## 2024-09-22 MED ORDER — SODIUM CHLORIDE 0.9 % IV BOLUS
1000.0000 mL | Freq: Once | INTRAVENOUS | Status: AC
  Administered 2024-09-22: 1000 mL via INTRAVENOUS

## 2024-09-22 MED ORDER — LORAZEPAM 2 MG/ML IJ SOLN
0.5000 mg | Freq: Once | INTRAMUSCULAR | Status: AC
  Administered 2024-09-22: 0.5 mg via INTRAVENOUS
  Filled 2024-09-22: qty 1

## 2024-09-22 MED ORDER — DOCUSATE SODIUM 100 MG PO CAPS: 100.0000 mg | ORAL_CAPSULE | Freq: Two times a day (BID) | ORAL | 0 refills | Status: AC

## 2024-09-22 NOTE — Discharge Instructions (Signed)
 Please follow-up closely with your primary care doctor on an outpatient basis.  Return to emergency department immediately for any new or worsening symptoms as discussed at the bedside.

## 2024-09-22 NOTE — ED Provider Notes (Signed)
 [place] EMERGENCY DEPARTMENT AT [facility] Provider Note   CSN: [id number] Arrival date & time: 09/22/24  2048     Patient presents with: Abdominal Pain   Brenda Benton is a 18 y.o. female.   Patient is a 18 year old female who presents emergency department the chief complaint of right-sided abdominal pain, nausea and sensation as though the right side of her abdomen is swelling which began approximately 2 hours prior to arrival.  She has status postcholecystectomy.  She has had no recent falls or blunt abdominal wall trauma.  She denies any diarrhea or constipation.  She denies any dysuria or hematuria.  She has had no abnormal vaginal discharge or bleeding.  She denies any associated chest pain or shortness of breath.   Abdominal Pain      Prior to Admission medications   Medication Sig Start Date End Date Taking? Authorizing Provider  albuterol  (VENTOLIN  HFA) 108 (90 Base) MCG/ACT inhaler Inhale 1-2 puffs into the lungs every 6 (six) hours as needed for wheezing or shortness of breath.    [provider]  budesonide -formoterol  (SYMBICORT ) 160-4.5 MCG/ACT inhaler Inhale 2 puffs into the lungs 2 (two) times daily as needed (shortness of breath).    [provider]  ibuprofen (ADVIL) 200 MG tablet Take 400 mg by mouth every 6 (six) hours as needed for moderate pain (pain score 4-6).    [provider]  Respiratory Therapy Supplies (VORTEX HOLDING CHAMBER/MASK) DEVI Always use with inhaler to maximize drug delivery into the lungs. 09/20/21   Salvador, Vivian, DO    Allergies: Cephalexin  and Other    Review of Systems  Gastrointestinal:  Positive for abdominal pain.  All other systems reviewed and are negative.   Updated Vital Signs BP 119/75   Pulse 81   Temp 98.6 F (37 C) (Oral)   Resp 14   Ht 5' 3 (1.6 m)   Wt 56.7 kg   LMP 09/22/2024 (Exact Date)   SpO2 100%   BMI 22.14 kg/m   Physical Exam Vitals and nursing note reviewed.   Constitutional:      General: She is not in acute distress.    Appearance: Normal appearance. She is not ill-appearing.  HENT:     Head: Normocephalic and atraumatic.     Nose: Nose normal.     Mouth/Throat:     Mouth: Mucous membranes are moist.  Eyes:     Extraocular Movements: Extraocular movements intact.     Conjunctiva/sclera: Conjunctivae normal.     Pupils: Pupils are equal, round, and reactive to light.  Cardiovascular:     Rate and Rhythm: Normal rate and regular rhythm.     Pulses: Normal pulses.     Heart sounds: Normal heart sounds. No murmur heard.    No gallop.  Pulmonary:     Effort: Pulmonary effort is normal. No respiratory distress.     Breath sounds: Normal breath sounds. No stridor. No wheezing, rhonchi or rales.  Abdominal:     General: Abdomen is flat. Bowel sounds are normal.     Palpations: Abdomen is soft.     Tenderness: There is abdominal tenderness in the right upper quadrant, right lower quadrant and epigastric area.     Hernia: No hernia is present.  Musculoskeletal:        General: Normal range of motion.     Cervical back: Normal range of motion and neck supple.  Skin:    General: Skin is warm and dry.  Neurological:     General: No focal deficit present.     Mental Status: She is alert and oriented to person, place, and time. Mental status is at baseline.     Cranial Nerves: No cranial nerve deficit.     Motor: No weakness.  Psychiatric:        Mood and Affect: Mood normal.        Behavior: Behavior normal.        Thought Content: Thought content normal.        Judgment: Judgment normal.     (all labs ordered are listed, but only abnormal results are displayed) Labs Reviewed  CBC - Abnormal; Notable for the following components:      Result Value   Platelets 412 (*)    All other components within normal limits  LIPASE, BLOOD  COMPREHENSIVE METABOLIC PANEL WITH GFR  URINALYSIS, ROUTINE W REFLEX MICROSCOPIC  PREGNANCY, URINE  HCG,  SERUM, QUALITATIVE    EKG: None  Radiology: No results found.   Procedures   Medications Ordered in the ED  hyoscyamine  (LEVSIN ) tablet 0.125 mg (has no administration in time range)  morphine  (PF) 4 MG/ML injection 4 mg (4 mg Intravenous Given 09/22/24 2120)  ondansetron  (ZOFRAN ) injection 4 mg (4 mg Intravenous Given 09/22/24 2120)  sodium chloride  0.9 % bolus 1,000 mL (1,000 mLs Intravenous New Bag/Given 09/22/24 2119)  LORazepam  (ATIVAN ) injection 0.5 mg (0.5 mg Intravenous Given 09/22/24 2136)                                    Medical Decision Making Amount and/or Complexity of Data Reviewed Labs: ordered. Radiology: ordered.  Risk OTC drugs. Prescription drug management.   This patient presents to the ED for concern of abdominal pain differential diagnosis includes acute appendicitis, cholecystitis, bowel obstruction, diverticulitis, ovarian torsion or cyst, PID, tubo-ovarian abscess, pyelonephritis, kidney stone, pancreatitis, mesenteric ischemia, IBS    Additional history obtained:  Additional history obtained from mother External records from outside source obtained and reviewed including medical records   Lab Tests:  I Ordered, and personally interpreted labs.  The pertinent results include: Leukocytosis, no anemia, mild hypokalemia, normal kidney function liver function, unremarkable urinalysis   Imaging Studies ordered:  I ordered imaging studies including CT scan abdomen and pelvis I independently visualized and interpreted imaging which showed no acute surgical process, no appendicitis I agree with the radiologist interpretation   Medicines ordered and prescription drug management:  I ordered medication including morphine , Zofran , Levsin , Ativan , IV fluids, potassium for abdominal pain and hypokalemia Reevaluation of the patient after these medicines showed that the patient improved I have reviewed the patients home medicines and have made  adjustments as needed   Problem List / ED Course:  Patient is doing better at this time and is stable for discharge home.  Patient notes that pain has greatly improved with medications in the emergency department.  Discussed with patient CT scan of the abdomen and pelvis is overall unremarkable.  Low suspicion for acute surgical process at this time.  Do not suspect ultrasound of the pelvis is warranted as I have low suspicion for ovarian torsion at this point.  Symptoms are more epigastric and right upper quadrant versus the lower abdomen.  CT also demonstrated no acute changes within the uterus and ovaries.  Did discuss with patient and mother about the need for strict return precautions for any new  or worsening symptoms or pain localizes to the right lower quadrant.  Appendix was normal on CT scan at this time.  Low suspicion for cholangitis, choledocholithiasis as patient has normal liver function and no acute changes noted on CT scan of the biliary ducts.  Will treat symptomatically on outpatient basis for possible IBS flare as she does have a history of this.  Patient notes that his symptoms did worsen while having a bowel movement today.  Mother and patient voiced understanding to the plan and had no additional questions.  Discussed the need for close follow-up with primary care doctor on an outpatient basis.   Social Determinants of Health:  None        Final diagnoses:  None    ED Discharge Orders     None          Brenda Benton 09/22/24 2309    Dean Clarity, MD 09/23/24 [phone]

## 2024-09-22 NOTE — ED Triage Notes (Signed)
 C/o adominal pain, nausea starting about 6 pm tonight. Pain in right side of abdomen that goes into her flank.

## 2025-02-09 ENCOUNTER — Other Ambulatory Visit: Payer: MEDICAID
